# Patient Record
Sex: Female | Born: 1975 | Race: Black or African American | Hispanic: No | Marital: Single | Smoking: Never smoker
Health system: Southern US, Community
[De-identification: ages and names within clinical notes are randomized; demographics above are authoritative.]

## PROBLEM LIST (undated history)

## (undated) DIAGNOSIS — I10 Essential (primary) hypertension: Secondary | ICD-10-CM

## (undated) DIAGNOSIS — K5792 Diverticulitis of intestine, part unspecified, without perforation or abscess without bleeding: Secondary | ICD-10-CM

## (undated) HISTORY — PX: ABDOMINAL HYSTERECTOMY: SHX81

## (undated) HISTORY — PX: CHOLECYSTECTOMY: SHX55

## (undated) HISTORY — PX: SMALL INTESTINE SURGERY: SHX150

## (undated) HISTORY — PX: APPENDECTOMY: SHX54

---

## 1997-12-31 ENCOUNTER — Other Ambulatory Visit: Admission: RE | Admit: 1997-12-31 | Discharge: 1997-12-31 | Payer: Self-pay | Admitting: Obstetrics & Gynecology

## 1999-08-31 ENCOUNTER — Other Ambulatory Visit: Admission: RE | Admit: 1999-08-31 | Discharge: 1999-08-31 | Payer: Self-pay | Admitting: Obstetrics and Gynecology

## 2000-05-29 ENCOUNTER — Encounter: Admission: RE | Admit: 2000-05-29 | Discharge: 2000-05-29 | Payer: Self-pay | Admitting: Internal Medicine

## 2000-05-29 ENCOUNTER — Encounter: Payer: Self-pay | Admitting: Internal Medicine

## 2000-11-30 ENCOUNTER — Other Ambulatory Visit: Admission: RE | Admit: 2000-11-30 | Discharge: 2000-11-30 | Payer: Self-pay | Admitting: Obstetrics and Gynecology

## 2001-12-31 ENCOUNTER — Other Ambulatory Visit: Admission: RE | Admit: 2001-12-31 | Discharge: 2001-12-31 | Payer: Self-pay | Admitting: Obstetrics and Gynecology

## 2003-01-02 ENCOUNTER — Other Ambulatory Visit: Admission: RE | Admit: 2003-01-02 | Discharge: 2003-01-02 | Payer: Self-pay | Admitting: Obstetrics and Gynecology

## 2003-01-18 ENCOUNTER — Emergency Department (HOSPITAL_COMMUNITY): Admission: EM | Admit: 2003-01-18 | Discharge: 2003-01-18 | Payer: Self-pay | Admitting: Emergency Medicine

## 2003-01-20 ENCOUNTER — Ambulatory Visit (HOSPITAL_COMMUNITY): Admission: RE | Admit: 2003-01-20 | Discharge: 2003-01-20 | Payer: Self-pay | Admitting: Emergency Medicine

## 2003-01-20 ENCOUNTER — Encounter: Payer: Self-pay | Admitting: Emergency Medicine

## 2003-01-22 ENCOUNTER — Encounter (INDEPENDENT_AMBULATORY_CARE_PROVIDER_SITE_OTHER): Payer: Self-pay | Admitting: *Deleted

## 2003-01-22 ENCOUNTER — Ambulatory Visit (HOSPITAL_COMMUNITY): Admission: RE | Admit: 2003-01-22 | Discharge: 2003-01-23 | Payer: Self-pay | Admitting: Surgery

## 2003-01-22 ENCOUNTER — Encounter: Payer: Self-pay | Admitting: Surgery

## 2003-06-04 ENCOUNTER — Emergency Department (HOSPITAL_COMMUNITY): Admission: EM | Admit: 2003-06-04 | Discharge: 2003-06-04 | Payer: Self-pay | Admitting: Emergency Medicine

## 2004-04-29 ENCOUNTER — Other Ambulatory Visit: Admission: RE | Admit: 2004-04-29 | Discharge: 2004-04-29 | Payer: Self-pay | Admitting: Obstetrics and Gynecology

## 2004-07-01 ENCOUNTER — Emergency Department (HOSPITAL_COMMUNITY): Admission: EM | Admit: 2004-07-01 | Discharge: 2004-07-01 | Payer: Self-pay | Admitting: Emergency Medicine

## 2005-05-11 ENCOUNTER — Other Ambulatory Visit: Admission: RE | Admit: 2005-05-11 | Discharge: 2005-05-11 | Payer: Self-pay | Admitting: Obstetrics and Gynecology

## 2006-04-18 ENCOUNTER — Inpatient Hospital Stay (HOSPITAL_COMMUNITY): Admission: AD | Admit: 2006-04-18 | Discharge: 2006-04-18 | Payer: Self-pay | Admitting: Obstetrics and Gynecology

## 2006-05-05 ENCOUNTER — Inpatient Hospital Stay (HOSPITAL_COMMUNITY): Admission: AD | Admit: 2006-05-05 | Discharge: 2006-05-05 | Payer: Self-pay | Admitting: Obstetrics and Gynecology

## 2006-05-29 ENCOUNTER — Inpatient Hospital Stay (HOSPITAL_COMMUNITY): Admission: AD | Admit: 2006-05-29 | Discharge: 2006-06-01 | Payer: Self-pay | Admitting: Obstetrics and Gynecology

## 2006-05-30 ENCOUNTER — Encounter (INDEPENDENT_AMBULATORY_CARE_PROVIDER_SITE_OTHER): Payer: Self-pay | Admitting: *Deleted

## 2007-10-29 ENCOUNTER — Inpatient Hospital Stay (HOSPITAL_COMMUNITY): Admission: EM | Admit: 2007-10-29 | Discharge: 2007-11-03 | Payer: Self-pay | Admitting: Emergency Medicine

## 2007-12-01 ENCOUNTER — Emergency Department (HOSPITAL_COMMUNITY): Admission: EM | Admit: 2007-12-01 | Discharge: 2007-12-01 | Payer: Self-pay | Admitting: Emergency Medicine

## 2007-12-10 ENCOUNTER — Inpatient Hospital Stay (HOSPITAL_COMMUNITY): Admission: AD | Admit: 2007-12-10 | Discharge: 2007-12-22 | Payer: Self-pay | Admitting: Internal Medicine

## 2007-12-26 ENCOUNTER — Encounter: Admission: RE | Admit: 2007-12-26 | Discharge: 2007-12-26 | Payer: Self-pay | Admitting: *Deleted

## 2008-02-11 ENCOUNTER — Encounter: Admission: RE | Admit: 2008-02-11 | Discharge: 2008-02-11 | Payer: Self-pay | Admitting: General Surgery

## 2008-03-25 ENCOUNTER — Inpatient Hospital Stay (HOSPITAL_COMMUNITY): Admission: EM | Admit: 2008-03-25 | Discharge: 2008-04-02 | Payer: Self-pay | Admitting: Emergency Medicine

## 2008-03-26 ENCOUNTER — Ambulatory Visit: Payer: Self-pay | Admitting: Internal Medicine

## 2008-03-26 ENCOUNTER — Encounter (INDEPENDENT_AMBULATORY_CARE_PROVIDER_SITE_OTHER): Payer: Self-pay | Admitting: General Surgery

## 2008-08-05 ENCOUNTER — Inpatient Hospital Stay (HOSPITAL_COMMUNITY): Admission: EM | Admit: 2008-08-05 | Discharge: 2008-08-28 | Payer: Self-pay | Admitting: Emergency Medicine

## 2008-08-06 ENCOUNTER — Ambulatory Visit: Payer: Self-pay | Admitting: Infectious Diseases

## 2008-11-29 ENCOUNTER — Inpatient Hospital Stay (HOSPITAL_COMMUNITY): Admission: EM | Admit: 2008-11-29 | Discharge: 2008-12-11 | Payer: Self-pay | Admitting: Emergency Medicine

## 2008-12-03 ENCOUNTER — Encounter (INDEPENDENT_AMBULATORY_CARE_PROVIDER_SITE_OTHER): Payer: Self-pay | Admitting: General Surgery

## 2009-07-29 ENCOUNTER — Emergency Department (HOSPITAL_COMMUNITY): Admission: EM | Admit: 2009-07-29 | Discharge: 2009-07-29 | Payer: Self-pay | Admitting: Emergency Medicine

## 2009-08-10 ENCOUNTER — Emergency Department (HOSPITAL_COMMUNITY): Admission: EM | Admit: 2009-08-10 | Discharge: 2009-08-10 | Payer: Self-pay | Admitting: Emergency Medicine

## 2009-09-14 ENCOUNTER — Emergency Department (HOSPITAL_COMMUNITY): Admission: EM | Admit: 2009-09-14 | Discharge: 2009-09-14 | Payer: Self-pay | Admitting: Family Medicine

## 2009-12-22 IMAGING — CT CT PELVIS W/ CM
2 of 5 series · 16 of 46 positions shown, 18 images · IV contrast (APPLIED)
Comparison: 08/12/2008.

CT ABDOMEN

CLINICAL DATA: Abdominal pain.  Diverticulitis.  Status post
drainage.

CT ABDOMEN AND PELVIS WITH CONTRAST
TECHNIQUE: Multidetector CT imaging of the abdomen and pelvis was
performed using the standard protocol following bolus
administration of intravenous contrast.
Contrast: 100 ml Omnipaque 300.  The patient was premedicated with
steroids per standard protocol.

[Series 2: abd/pelv with 5.0 b31f st · axial · 0.81mm/px · z∈[+686,+1141]mm · 13 of 103 slices shown, 15 images]
[im 6/103  soft-tissue]
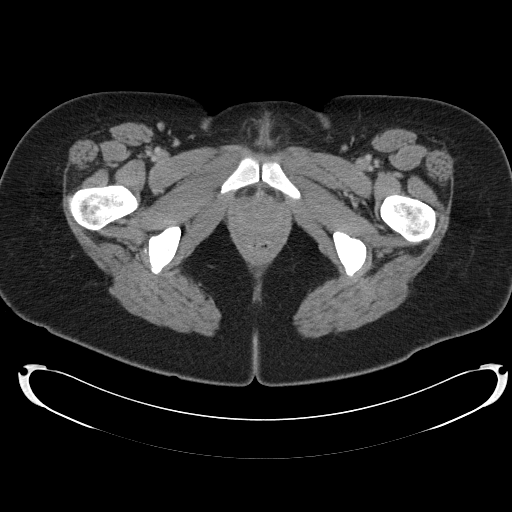
[im 6/103  bone]
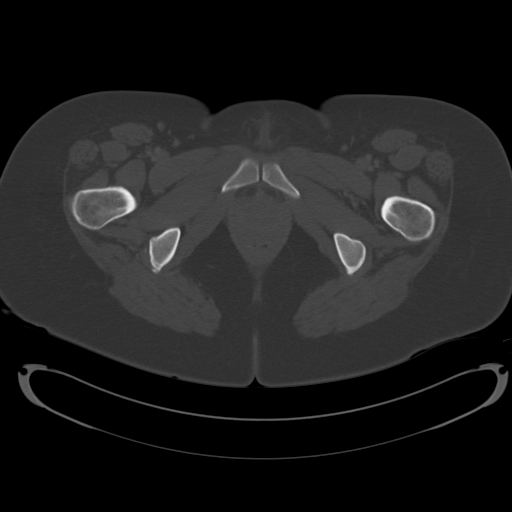
[im 12/103  soft-tissue]
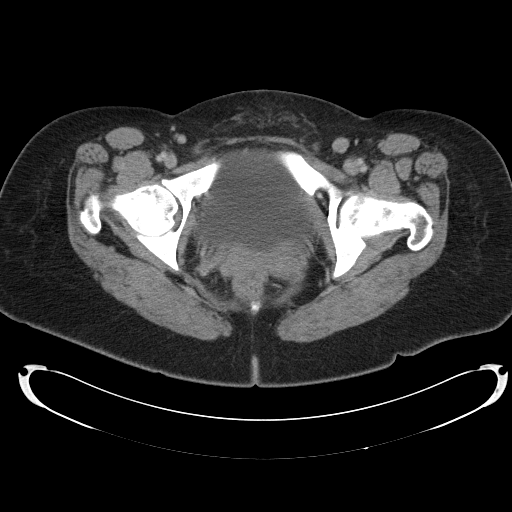
[im 23/103  soft-tissue]
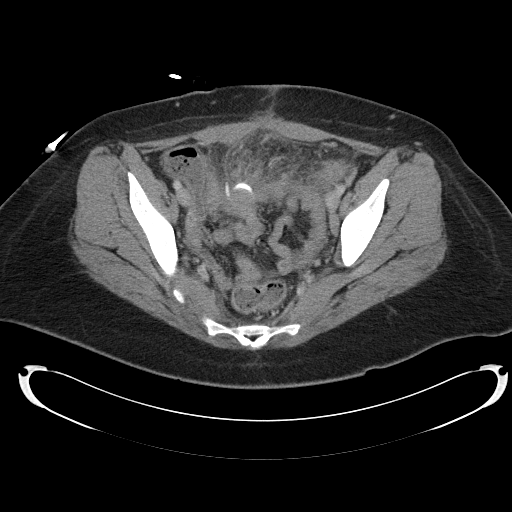
[im 29/103  soft-tissue]
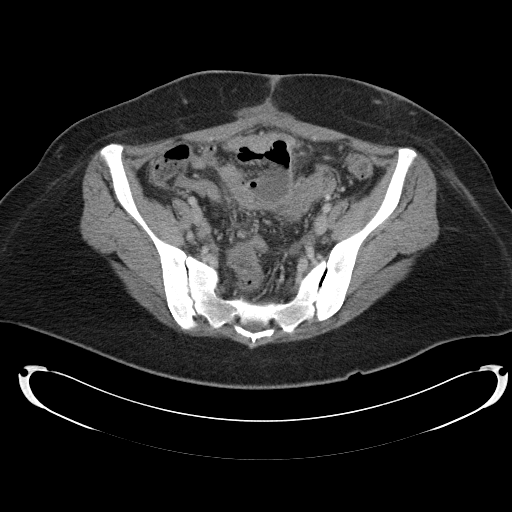
[im 35/103  soft-tissue]
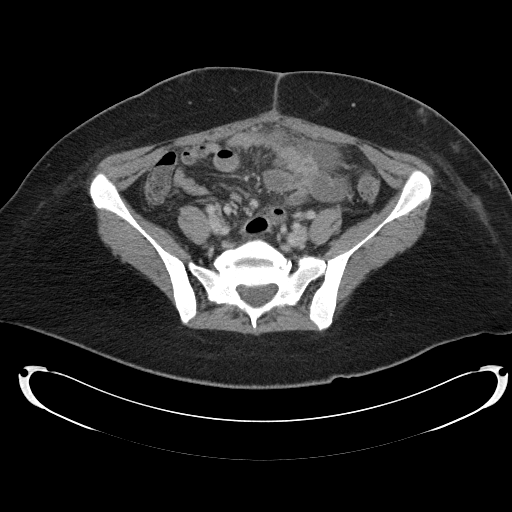
[im 46/103  soft-tissue]
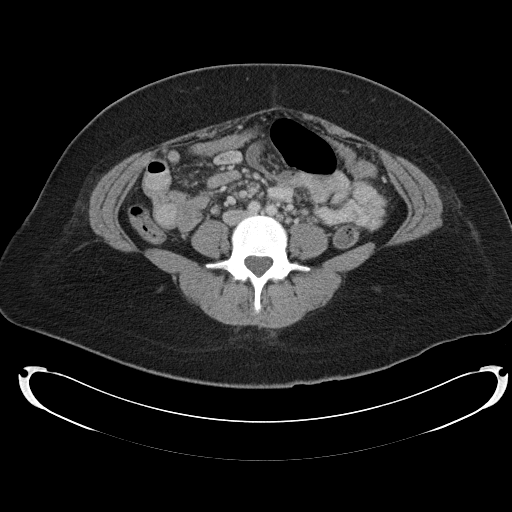
[im 52/103  soft-tissue]
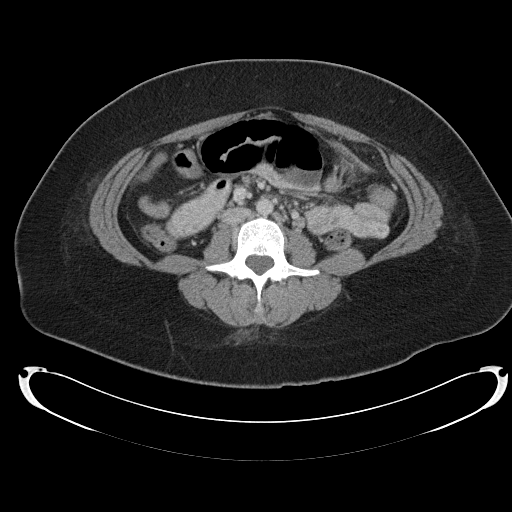
[im 57/103  soft-tissue]
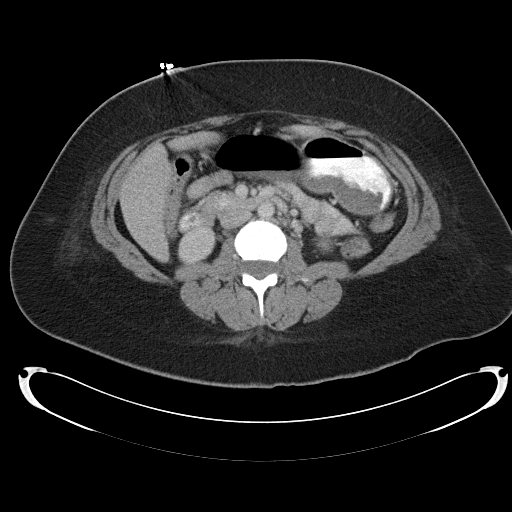
[im 69/103  soft-tissue]
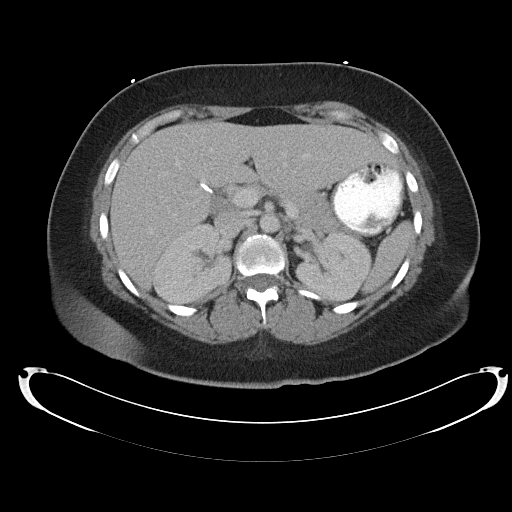
[im 69/103  bone]
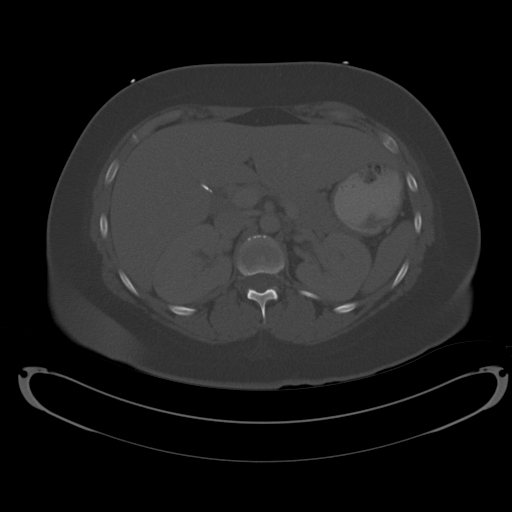
[im 74/103  soft-tissue]
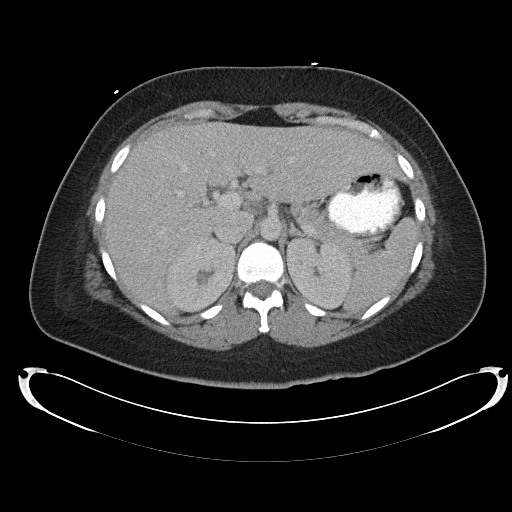
[im 80/103  soft-tissue]
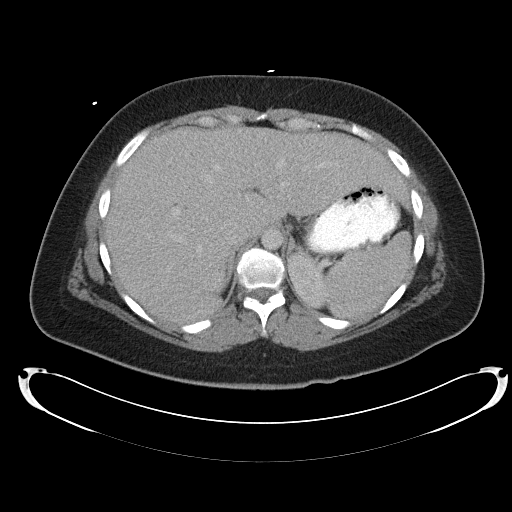
[im 91/103  soft-tissue]
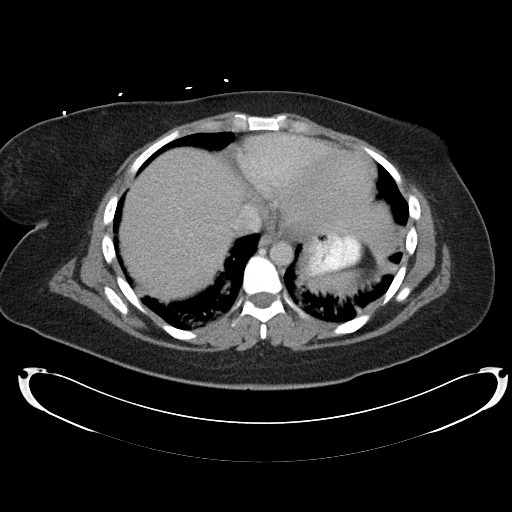
[im 97/103  soft-tissue]
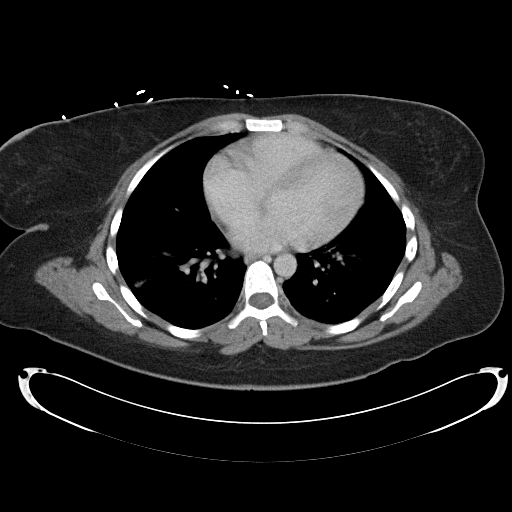

[Series 6: abd/pelv with 2.0 spo cor st · coronal · 1.00mm/px · 3 of 115 slices shown]
[im 39/115  soft-tissue]
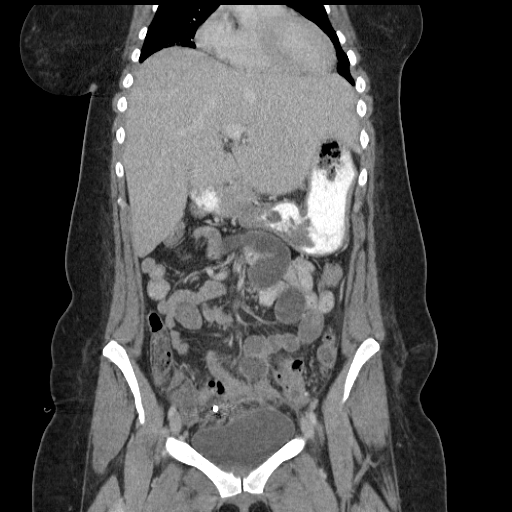
[im 51/115  soft-tissue]
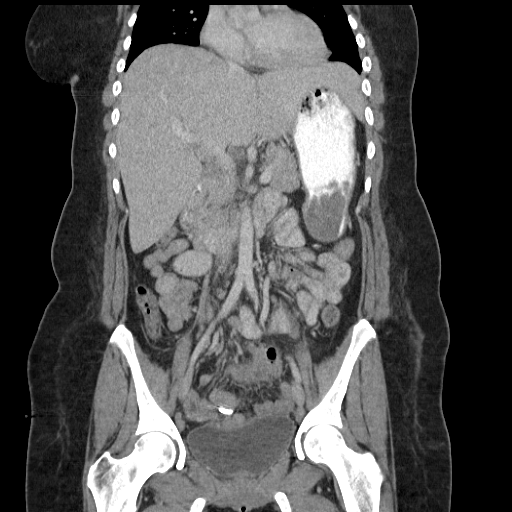
[im 64/115  soft-tissue]
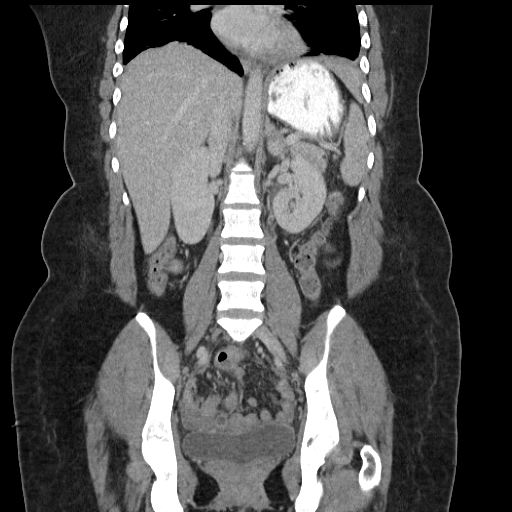

[16 of 46 positions shown; findings below may reference images not displayed]

FINDINGS: Patchy airspace disease at the lung bases bilaterally is
most compatible with atelectasis.  The heart size is normal.  The
liver and spleen are unremarkable.  The stomach, pancreas, and
common bile duct are within normal limits.  The adrenal glands and
kidneys are normal.  There are focally dilated loops of small bowel
with air-fluid levels in the mid abdomen.  The most proximal small
bowel is unremarkable and not dilated.  Bone windows are
unremarkable.
IMPRESSION: 1.  Persistent partial small bowel obstruction with transition
point at the level of the abscess in the pelvis.  The most proximal
small bowel is not dilated, suggesting interval improvement.
2.  Status post cholecystectomy.

CT PELVIS
FINDINGS: Inflammatory changes persist about the sigmoid colon
compatible with diverticulitis.  A drainage catheter can be seen
within the pelvis.  Fluid around this catheter is drained.  There
are two persistent loculated areas of fluid.  The first is just
above the drainage catheter and appears partially drained since the
prior exam.  It measures 2.4 x 2.1 by 2.1 cm.  Further to the left
is slightly larger collection, now the largest collection measuring
2.4 x 3.6 x 2.2 cm.  It is unclear whether these two collections
communicate although the both do appear smaller than on the prior
exam.  The urinary bladder is within normal limits.  The patient
appears to be status post hysterectomy.
IMPRESSION: 1.  Significant decrease in pelvic fluid collections status post
placement of a drainage catheter.  Two small collections remain as
described.
2.  Inflammatory changes of the mesentery persist.
3.  Status post hysterectomy.

## 2010-02-11 ENCOUNTER — Emergency Department (HOSPITAL_COMMUNITY): Admission: EM | Admit: 2010-02-11 | Discharge: 2010-02-11 | Payer: Self-pay | Admitting: Family Medicine

## 2010-03-02 ENCOUNTER — Emergency Department (HOSPITAL_COMMUNITY): Admission: EM | Admit: 2010-03-02 | Discharge: 2010-03-02 | Payer: Self-pay | Admitting: Family Medicine

## 2010-04-06 ENCOUNTER — Emergency Department (HOSPITAL_COMMUNITY): Admission: EM | Admit: 2010-04-06 | Discharge: 2010-04-06 | Payer: Self-pay | Admitting: Emergency Medicine

## 2010-04-14 ENCOUNTER — Emergency Department (HOSPITAL_COMMUNITY): Admission: EM | Admit: 2010-04-14 | Discharge: 2010-04-14 | Payer: Self-pay | Admitting: Emergency Medicine

## 2010-09-05 ENCOUNTER — Encounter: Payer: Self-pay | Admitting: Obstetrics and Gynecology

## 2010-09-05 ENCOUNTER — Encounter (HOSPITAL_BASED_OUTPATIENT_CLINIC_OR_DEPARTMENT_OTHER): Payer: Self-pay | Admitting: General Surgery

## 2010-11-02 ENCOUNTER — Emergency Department (HOSPITAL_COMMUNITY)
Admission: EM | Admit: 2010-11-02 | Discharge: 2010-11-02 | Disposition: A | Discharge status: Home / Self Care | Payer: Self-pay | Attending: Emergency Medicine | Admitting: Emergency Medicine

## 2010-11-02 ENCOUNTER — Emergency Department (HOSPITAL_COMMUNITY): Payer: Self-pay

## 2010-11-02 DIAGNOSIS — R071 Chest pain on breathing: Secondary | ICD-10-CM | POA: Insufficient documentation

## 2010-11-02 DIAGNOSIS — R0602 Shortness of breath: Secondary | ICD-10-CM | POA: Insufficient documentation

## 2010-11-02 DIAGNOSIS — R0609 Other forms of dyspnea: Secondary | ICD-10-CM | POA: Insufficient documentation

## 2010-11-02 DIAGNOSIS — R0989 Other specified symptoms and signs involving the circulatory and respiratory systems: Secondary | ICD-10-CM | POA: Insufficient documentation

## 2010-11-02 LAB — DIFFERENTIAL
Basophils Relative: 0 % (ref 0–1)
Eosinophils Absolute: 0.1 10*3/uL (ref 0.0–0.7)
Monocytes Absolute: 0.6 10*3/uL (ref 0.1–1.0)
Monocytes Relative: 9 % (ref 3–12)
Neutrophils Relative %: 54 % (ref 43–77)

## 2010-11-02 LAB — COMPREHENSIVE METABOLIC PANEL
AST: 23 U/L (ref 0–37)
BUN: 6 mg/dL (ref 6–23)
CO2: 29 mEq/L (ref 19–32)
Chloride: 102 mEq/L (ref 96–112)
Creatinine, Ser: 0.8 mg/dL (ref 0.4–1.2)
GFR calc non Af Amer: >60 mL/min (ref >60)
Total Bilirubin: 0.2 mg/dL — ABNORMAL LOW (ref 0.3–1.2)

## 2010-11-02 LAB — CBC
MCH: 31.2 pg (ref 26.0–34.0)
MCHC: 34.3 g/dL (ref 30.0–36.0)
Platelets: 333 10*3/uL (ref 150–400)
RDW: 12.9 % (ref 11.5–15.5)

## 2010-11-02 LAB — POCT CARDIAC MARKERS

## 2010-11-24 LAB — URINE MICROSCOPIC-ADD ON

## 2010-11-24 LAB — BASIC METABOLIC PANEL
BUN: 4 mg/dL — ABNORMAL LOW (ref 6–23)
BUN: 9 mg/dL (ref 6–23)
CO2: 29 mEq/L (ref 19–32)
Calcium: 7.9 mg/dL — ABNORMAL LOW (ref 8.4–10.5)
Calcium: 8.1 mg/dL — ABNORMAL LOW (ref 8.4–10.5)
Calcium: 8.3 mg/dL — ABNORMAL LOW (ref 8.4–10.5)
Calcium: 8.4 mg/dL (ref 8.4–10.5)
Chloride: 104 mEq/L (ref 96–112)
Chloride: 104 mEq/L (ref 96–112)
Chloride: 105 mEq/L (ref 96–112)
Creatinine, Ser: 0.56 mg/dL (ref 0.4–1.2)
Creatinine, Ser: 0.65 mg/dL (ref 0.4–1.2)
Creatinine, Ser: 0.83 mg/dL (ref 0.4–1.2)
GFR calc Af Amer: >60 mL/min (ref >60)
GFR calc Af Amer: >60 mL/min (ref >60)
GFR calc Af Amer: >60 mL/min (ref >60)
GFR calc Af Amer: >60 mL/min (ref >60)
GFR calc non Af Amer: >60 mL/min (ref >60)
GFR calc non Af Amer: >60 mL/min (ref >60)
GFR calc non Af Amer: >60 mL/min (ref >60)
GFR calc non Af Amer: >60 mL/min (ref >60)
Glucose, Bld: 123 mg/dL — ABNORMAL HIGH (ref 70–99)
Glucose, Bld: 93 mg/dL (ref 70–99)
Potassium: 3.4 mEq/L — ABNORMAL LOW (ref 3.5–5.1)
Sodium: 129 mEq/L — ABNORMAL LOW (ref 135–145)

## 2010-11-24 LAB — URINALYSIS, ROUTINE W REFLEX MICROSCOPIC
Bilirubin Urine: NEGATIVE
Nitrite: NEGATIVE
Protein, ur: NEGATIVE mg/dL
Specific Gravity, Urine: 1.026 (ref 1.005–1.030)
Urobilinogen, UA: 0.2 mg/dL (ref 0.0–1.0)

## 2010-11-24 LAB — CBC
HCT: 30.3 % — ABNORMAL LOW (ref 36.0–46.0)
HCT: 35 % — ABNORMAL LOW (ref 36.0–46.0)
Hemoglobin: 10.8 g/dL — ABNORMAL LOW (ref 12.0–15.0)
Hemoglobin: 11.1 g/dL — ABNORMAL LOW (ref 12.0–15.0)
Hemoglobin: 12.2 g/dL (ref 12.0–15.0)
MCHC: 34.4 g/dL (ref 30.0–36.0)
MCHC: 34.9 g/dL (ref 30.0–36.0)
MCHC: 35.2 g/dL (ref 30.0–36.0)
MCV: 94.1 fL (ref 78.0–100.0)
MCV: 94.4 fL (ref 78.0–100.0)
MCV: 94.9 fL (ref 78.0–100.0)
Platelets: 259 10*3/uL (ref 150–400)
Platelets: 267 10*3/uL (ref 150–400)
Platelets: 274 10*3/uL (ref 150–400)
Platelets: 313 10*3/uL (ref 150–400)
RBC: 2.99 MIL/uL — ABNORMAL LOW (ref 3.87–5.11)
RBC: 3.21 MIL/uL — ABNORMAL LOW (ref 3.87–5.11)
RBC: 3.24 MIL/uL — ABNORMAL LOW (ref 3.87–5.11)
RBC: 3.4 MIL/uL — ABNORMAL LOW (ref 3.87–5.11)
RBC: 3.66 MIL/uL — ABNORMAL LOW (ref 3.87–5.11)
RDW: 13.2 % (ref 11.5–15.5)
RDW: 14 % (ref 11.5–15.5)
WBC: 4.5 10*3/uL (ref 4.0–10.5)
WBC: 5.3 10*3/uL (ref 4.0–10.5)
WBC: 6.2 10*3/uL (ref 4.0–10.5)
WBC: 8.2 10*3/uL (ref 4.0–10.5)

## 2010-11-24 LAB — CULTURE, ROUTINE-ABSCESS

## 2010-11-24 LAB — COMPREHENSIVE METABOLIC PANEL
ALT: 8 U/L (ref 0–35)
Alkaline Phosphatase: 13 U/L — ABNORMAL LOW (ref 39–117)
CO2: 28 mEq/L (ref 19–32)
Calcium: 7.8 mg/dL — ABNORMAL LOW (ref 8.4–10.5)
Chloride: 99 mEq/L (ref 96–112)
GFR calc non Af Amer: >60 mL/min (ref >60)
Glucose, Bld: 106 mg/dL — ABNORMAL HIGH (ref 70–99)
Sodium: 134 mEq/L — ABNORMAL LOW (ref 135–145)
Total Bilirubin: 0.4 mg/dL (ref 0.3–1.2)

## 2010-11-24 LAB — DIFFERENTIAL
Eosinophils Absolute: 0 10*3/uL (ref 0.0–0.7)
Eosinophils Relative: 0 % (ref 0–5)
Lymphs Abs: 0.6 10*3/uL — ABNORMAL LOW (ref 0.7–4.0)
Monocytes Absolute: 0.4 10*3/uL (ref 0.1–1.0)
Monocytes Relative: 5 % (ref 3–12)

## 2010-11-24 LAB — TYPE AND SCREEN
ABO/RH(D): A POS
Antibody Screen: NEGATIVE

## 2010-11-24 LAB — GLUCOSE, CAPILLARY: Glucose-Capillary: 119 mg/dL — ABNORMAL HIGH (ref 70–99)

## 2010-11-24 LAB — PHOSPHORUS: Phosphorus: 3.5 mg/dL (ref 2.3–4.6)

## 2010-11-24 LAB — POCT I-STAT, CHEM 8
BUN: 13 mg/dL (ref 6–23)
Calcium, Ion: 1.07 mmol/L — ABNORMAL LOW (ref 1.12–1.32)
Creatinine, Ser: 1 mg/dL (ref 0.4–1.2)
Glucose, Bld: 105 mg/dL — ABNORMAL HIGH (ref 70–99)
Hemoglobin: 12.9 g/dL (ref 12.0–15.0)
Sodium: 137 mEq/L (ref 135–145)
TCO2: 25 mmol/L (ref 0–100)

## 2010-11-24 LAB — ANAEROBIC CULTURE

## 2010-11-29 LAB — DIFFERENTIAL
Band Neutrophils: 0 % (ref 0–10)
Basophils Absolute: 0 10*3/uL (ref 0.0–0.1)
Basophils Absolute: 0 10*3/uL (ref 0.0–0.1)
Blasts: 0 %
Blasts: 0 %
Eosinophils Absolute: 0 10*3/uL (ref 0.0–0.7)
Eosinophils Absolute: 0 10*3/uL (ref 0.0–0.7)
Eosinophils Absolute: 0.1 10*3/uL (ref 0.0–0.7)
Eosinophils Relative: 1 % (ref 0–5)
Lymphocytes Relative: 15 % (ref 12–46)
Lymphocytes Relative: 34 % (ref 12–46)
Lymphocytes Relative: 37 % (ref 12–46)
Lymphocytes Relative: 41 % (ref 12–46)
Lymphs Abs: 0.5 10*3/uL — ABNORMAL LOW (ref 0.7–4.0)
Lymphs Abs: 1.4 10*3/uL (ref 0.7–4.0)
Lymphs Abs: 2.6 10*3/uL (ref 0.7–4.0)
Metamyelocytes Relative: 0 %
Monocytes Absolute: 1.1 10*3/uL — ABNORMAL HIGH (ref 0.1–1.0)
Monocytes Relative: 27 % — ABNORMAL HIGH (ref 3–12)
Myelocytes: 0 %
Neutro Abs: 1.5 10*3/uL — ABNORMAL LOW (ref 1.7–7.7)
Neutro Abs: 1.8 10*3/uL (ref 1.7–7.7)
Neutrophils Relative %: 37 % — ABNORMAL LOW (ref 43–77)
Neutrophils Relative %: 41 % — ABNORMAL LOW (ref 43–77)
Neutrophils Relative %: 46 % (ref 43–77)
Neutrophils Relative %: 78 % — ABNORMAL HIGH (ref 43–77)
Promyelocytes Absolute: 0 %
Promyelocytes Absolute: 0 %
nRBC: 0 /100 WBC
nRBC: 0 /100 WBC

## 2010-11-29 LAB — COMPREHENSIVE METABOLIC PANEL
ALT: 14 U/L (ref 0–35)
ALT: 23 U/L (ref 0–35)
AST: 13 U/L (ref 0–37)
Albumin: 2.8 g/dL — ABNORMAL LOW (ref 3.5–5.2)
Albumin: 2.9 g/dL — ABNORMAL LOW (ref 3.5–5.2)
Alkaline Phosphatase: 23 U/L — ABNORMAL LOW (ref 39–117)
Alkaline Phosphatase: 24 U/L — ABNORMAL LOW (ref 39–117)
BUN: 11 mg/dL (ref 6–23)
BUN: 9 mg/dL (ref 6–23)
CO2: 24 mEq/L (ref 19–32)
CO2: 28 mEq/L (ref 19–32)
Calcium: 8.8 mg/dL (ref 8.4–10.5)
Calcium: 8.8 mg/dL (ref 8.4–10.5)
Calcium: 8.8 mg/dL (ref 8.4–10.5)
Chloride: 99 mEq/L (ref 96–112)
Creatinine, Ser: 0.6 mg/dL (ref 0.4–1.2)
Creatinine, Ser: 0.64 mg/dL (ref 0.4–1.2)
GFR calc Af Amer: >60 mL/min (ref >60)
GFR calc non Af Amer: >60 mL/min (ref >60)
GFR calc non Af Amer: >60 mL/min (ref >60)
Glucose, Bld: 265 mg/dL — ABNORMAL HIGH (ref 70–99)
Glucose, Bld: 91 mg/dL (ref 70–99)
Glucose, Bld: 96 mg/dL (ref 70–99)
Potassium: 3.8 mEq/L (ref 3.5–5.1)
Potassium: 4.4 mEq/L (ref 3.5–5.1)
Sodium: 132 mEq/L — ABNORMAL LOW (ref 135–145)
Sodium: 136 mEq/L (ref 135–145)
Total Bilirubin: 0.6 mg/dL (ref 0.3–1.2)
Total Protein: 7.6 g/dL (ref 6.0–8.3)

## 2010-11-29 LAB — BASIC METABOLIC PANEL
BUN: 11 mg/dL (ref 6–23)
CO2: 22 mEq/L (ref 19–32)
CO2: 24 mEq/L (ref 19–32)
CO2: 24 mEq/L (ref 19–32)
CO2: 26 mEq/L (ref 19–32)
Calcium: 8.2 mg/dL — ABNORMAL LOW (ref 8.4–10.5)
Calcium: 9 mg/dL (ref 8.4–10.5)
Chloride: 101 mEq/L (ref 96–112)
Chloride: 92 mEq/L — ABNORMAL LOW (ref 96–112)
Creatinine, Ser: 0.63 mg/dL (ref 0.4–1.2)
Creatinine, Ser: 0.63 mg/dL (ref 0.4–1.2)
GFR calc Af Amer: >60 mL/min (ref >60)
GFR calc Af Amer: >60 mL/min (ref >60)
GFR calc Af Amer: >60 mL/min (ref >60)
GFR calc Af Amer: >60 mL/min (ref >60)
GFR calc non Af Amer: >60 mL/min (ref >60)
GFR calc non Af Amer: >60 mL/min (ref >60)
Glucose, Bld: 173 mg/dL — ABNORMAL HIGH (ref 70–99)
Glucose, Bld: 531 mg/dL (ref 70–99)
Potassium: 3.1 mEq/L — ABNORMAL LOW (ref 3.5–5.1)
Potassium: 3.8 mEq/L (ref 3.5–5.1)
Potassium: 4.9 mEq/L (ref 3.5–5.1)
Sodium: 132 mEq/L — ABNORMAL LOW (ref 135–145)
Sodium: 132 mEq/L — ABNORMAL LOW (ref 135–145)
Sodium: 136 mEq/L (ref 135–145)
Sodium: 137 mEq/L (ref 135–145)

## 2010-11-29 LAB — CBC
HCT: 29.7 % — ABNORMAL LOW (ref 36.0–46.0)
HCT: 30 % — ABNORMAL LOW (ref 36.0–46.0)
HCT: 30.8 % — ABNORMAL LOW (ref 36.0–46.0)
HCT: 31.3 % — ABNORMAL LOW (ref 36.0–46.0)
Hemoglobin: 10.1 g/dL — ABNORMAL LOW (ref 12.0–15.0)
Hemoglobin: 10.2 g/dL — ABNORMAL LOW (ref 12.0–15.0)
Hemoglobin: 10.6 g/dL — ABNORMAL LOW (ref 12.0–15.0)
Hemoglobin: 9.7 g/dL — ABNORMAL LOW (ref 12.0–15.0)
Hemoglobin: 9.8 g/dL — ABNORMAL LOW (ref 12.0–15.0)
Hemoglobin: 9.9 g/dL — ABNORMAL LOW (ref 12.0–15.0)
MCHC: 33 g/dL (ref 30.0–36.0)
MCHC: 33.3 g/dL (ref 30.0–36.0)
MCHC: 33.3 g/dL (ref 30.0–36.0)
MCHC: 33.3 g/dL (ref 30.0–36.0)
MCHC: 33.7 g/dL (ref 30.0–36.0)
MCHC: 33.8 g/dL (ref 30.0–36.0)
MCHC: 34.1 g/dL (ref 30.0–36.0)
MCHC: 34.7 g/dL (ref 30.0–36.0)
MCV: 94.2 fL (ref 78.0–100.0)
MCV: 94.2 fL (ref 78.0–100.0)
MCV: 94.5 fL (ref 78.0–100.0)
MCV: 94.5 fL (ref 78.0–100.0)
MCV: 94.9 fL (ref 78.0–100.0)
Platelets: 384 10*3/uL (ref 150–400)
Platelets: 405 10*3/uL — ABNORMAL HIGH (ref 150–400)
Platelets: 432 10*3/uL — ABNORMAL HIGH (ref 150–400)
Platelets: 447 10*3/uL — ABNORMAL HIGH (ref 150–400)
RBC: 3.04 MIL/uL — ABNORMAL LOW (ref 3.87–5.11)
RBC: 3.1 MIL/uL — ABNORMAL LOW (ref 3.87–5.11)
RBC: 3.14 MIL/uL — ABNORMAL LOW (ref 3.87–5.11)
RBC: 3.24 MIL/uL — ABNORMAL LOW (ref 3.87–5.11)
RBC: 3.25 MIL/uL — ABNORMAL LOW (ref 3.87–5.11)
RDW: 14.4 % (ref 11.5–15.5)
RDW: 14.4 % (ref 11.5–15.5)
RDW: 14.6 % (ref 11.5–15.5)
RDW: 14.7 % (ref 11.5–15.5)
RDW: 14.7 % (ref 11.5–15.5)
WBC: 11.1 10*3/uL — ABNORMAL HIGH (ref 4.0–10.5)
WBC: 4.3 10*3/uL (ref 4.0–10.5)

## 2010-11-29 LAB — URINALYSIS, ROUTINE W REFLEX MICROSCOPIC
Bilirubin Urine: NEGATIVE
Glucose, UA: NEGATIVE mg/dL
Leukocytes, UA: NEGATIVE
Nitrite: NEGATIVE
Specific Gravity, Urine: 1.011 (ref 1.005–1.030)
pH: 6 (ref 5.0–8.0)

## 2010-11-29 LAB — GLUCOSE, CAPILLARY
Glucose-Capillary: 103 mg/dL — ABNORMAL HIGH (ref 70–99)
Glucose-Capillary: 114 mg/dL — ABNORMAL HIGH (ref 70–99)
Glucose-Capillary: 116 mg/dL — ABNORMAL HIGH (ref 70–99)
Glucose-Capillary: 117 mg/dL — ABNORMAL HIGH (ref 70–99)
Glucose-Capillary: 129 mg/dL — ABNORMAL HIGH (ref 70–99)
Glucose-Capillary: 133 mg/dL — ABNORMAL HIGH (ref 70–99)
Glucose-Capillary: 133 mg/dL — ABNORMAL HIGH (ref 70–99)
Glucose-Capillary: 142 mg/dL — ABNORMAL HIGH (ref 70–99)
Glucose-Capillary: 170 mg/dL — ABNORMAL HIGH (ref 70–99)
Glucose-Capillary: 171 mg/dL — ABNORMAL HIGH (ref 70–99)
Glucose-Capillary: 223 mg/dL — ABNORMAL HIGH (ref 70–99)
Glucose-Capillary: 274 mg/dL — ABNORMAL HIGH (ref 70–99)
Glucose-Capillary: 294 mg/dL — ABNORMAL HIGH (ref 70–99)
Glucose-Capillary: 58 mg/dL — ABNORMAL LOW (ref 70–99)
Glucose-Capillary: 68 mg/dL — ABNORMAL LOW (ref 70–99)
Glucose-Capillary: 73 mg/dL (ref 70–99)
Glucose-Capillary: 76 mg/dL (ref 70–99)
Glucose-Capillary: 78 mg/dL (ref 70–99)
Glucose-Capillary: 79 mg/dL (ref 70–99)
Glucose-Capillary: 84 mg/dL (ref 70–99)
Glucose-Capillary: 90 mg/dL (ref 70–99)
Glucose-Capillary: 91 mg/dL (ref 70–99)
Glucose-Capillary: 92 mg/dL (ref 70–99)
Glucose-Capillary: 96 mg/dL (ref 70–99)
Glucose-Capillary: 98 mg/dL (ref 70–99)

## 2010-11-29 LAB — CATH TIP CULTURE: Culture: NO GROWTH

## 2010-11-29 LAB — CLOSTRIDIUM DIFFICILE EIA: C difficile Toxins A+B, EIA: NEGATIVE

## 2010-11-29 LAB — CULTURE, BLOOD (ROUTINE X 2): Culture: NO GROWTH

## 2010-11-29 LAB — MAGNESIUM
Magnesium: 1.7 mg/dL (ref 1.5–2.5)
Magnesium: 1.9 mg/dL (ref 1.5–2.5)
Magnesium: 2.2 mg/dL (ref 1.5–2.5)

## 2010-11-29 LAB — PHOSPHORUS
Phosphorus: 2.7 mg/dL (ref 2.3–4.6)
Phosphorus: 4.1 mg/dL (ref 2.3–4.6)
Phosphorus: 5.2 mg/dL — ABNORMAL HIGH (ref 2.3–4.6)

## 2010-11-29 LAB — URINE MICROSCOPIC-ADD ON

## 2010-11-29 LAB — URINE CULTURE: Special Requests: NEGATIVE

## 2010-12-28 NOTE — Discharge Summary (Signed)
[name], [name]                  ACCOUNT NO.:  [id number]   MEDICAL RECORD NO.:  [id number]          PATIENT TYPE:  INP   LOCATION:  1526                         FACILITY:  [facility]   PHYSICIAN:  Mobolaji B. Bakare, M.D.DATE OF BIRTH:  11/12/1975   DATE OF ADMISSION:  12/10/2007  DATE OF DISCHARGE:  12/22/2007                               DISCHARGE SUMMARY   PRIMARY CARE PHYSICIAN:  Unassigned.   GYNECOLOGIST:  Naima A. Normand Sloop, M.D.   FINAL DIAGNOSES:  1. Sepsis secondary to acute diverticulitis.  2. Acute diverticulitis with microperforations.  3. Pelvic abscess secondary to acute diverticulitis.   CONSULTANT:  1. Gastroenterology consult provided by Graylin Shiver, MD.  2. Surgical consult provided by Alfonse Ras, MD.  3. Interventional radiology consult provided by D. Oley Balm, MD.   PROCEDURES:  1. CT scan of abdomen and pelvis done on December 10, 2007, showed no      evidence of abdominal process, increased bibasilar atelectasis on      abdominal CT.  CT of the pelvis showed small amount of free fluid      in the inferior pelvis.  No distinct abscess identified  2. Ultrasound of the pelvis done on December 09, 2007, showed small to      moderate amount of complex fluid within the pelvis.  Normal      appearance of the uterus and adnexa.  Dominant follicle within the      left ovary.  3. CT scan of the abdomen done on Dec 17, 2007, showed small bilateral      pleural effusions, no acute abdominal process.  4. CT scan of the pelvis done on Dec 17, 2007, showed significant      increase in pelvic abscess collection measuring 5 x 14 cm in the      anterior component and smaller size of 4 x 9.9 cm, which is smaller      and deeper component to the first collection.  There also appears      to be a confluence at the left side of the uterine fundus.  There      was a new 8 x 15-mm collection inferior to the cecum and a 15 x 20-      mm collection in the anterior left  pelvis.  The colon was      decompressed.  5. CT-guided drainage by Dr. Deanne Coffer on Dec 17, 2007, yielded      approximately 155 mL of thin, purulent fluid.  6. CT-guided drainage of pelvic abscess via  left transgluteal      approach done on Dec 17, 2007, by Dr. Bonnielee Haff yielded 10 mL of pus.   BRIEF HISTORY:  Please refer to the admission H&P for full details.  In  brief, Ms. Turberville is a pleasant 35 year old African American female who  was in her usual state of health until the day of presentation, when she  developed left lower quadrant pain.  She initially thought it was  gynecologic in nature.  She had an IUD previously which she had  expelled, and she called her gynecologist and was initially evaluated by  Dr. Redmond Baseman team at [facility].  Upon initial evaluation with an  ultrasound of the pelvis which indicated pelvic ascites, a surgical  consult and GI consult were obtained.  It should be mentioned that the  patient was recently treated for diverticulitis in March 2009.  Upon  evaluation by Dr. Colin Benton and Dr. Evette Cristal, CT scan was obtained which showed  pelvic ascites.  The patient was transferred to [facility]  step-down unit.  She was appeared to be septic on transfer with  tachycardia.  Blood pressure was reasonable on arrival at 105/53.  She  had leukocytosis 17.3, and she was febrile with a temperature of 102.  The patient was empirically started on antibiotics aztreonam and  vancomycin (she is allergic to PENICILLIN).   HOSPITAL COURSE:  The patient was evaluated by Dr. Colin Benton and her  impression was recurrent or continued acute diverticulitis given recent  treatment for acute diverticulitis in March 2009.  She was started on  vancomycin, Flagyl and aztreonam.  The patient remained febrile  continuously and a repeat CT scan was done on Dec 17, 2007 which showed  increased pelvic ascites and abscess, hence an immediate/emergent  drainage was done by Dr. Deanne Coffer.  He  drained about 155 mL of purulent  fluid from the larger abscess collection.  A follow-up drainage was done  through a transgluteal approach to the smaller abscess collection on Dec 19, 2007.  The culture came back with no growth on both specimen, both  anaerobic and aerobic.  Blood cultures also no growth.  Therefore,  vancomycin was discontinued.  The patient was then started on  ciprofloxacin and Flagyl.  She has become afebrile 48 hours prior to  discharge.  Sepsis has resolved.  White cell count was 8.7 at time of  discharge.  The drainage from the pelvic drains was quite minimal with  the right drain (10 mL) and left transgluteal 12 drain (12 mL).  The  patient will be discharged home on p.o. antibiotics.  She will have a  repeat CT scan on Dec 26, 2007, and follow up with Dr. Colin Benton in 2-3  weeks.   DISCHARGE INSTRUCTIONS:  1. CT scan at [facility] on Dec 26, 2007, at 8:15 a.m.  2. Flush both drains with 5 mL sterile normal saline daily.  3. Change dressing daily.  4. Record output from each drain.  The patient was given instruction      to call if she has any questions.  5. Follow up with Dr. Colin Benton in 2-3 weeks.   DISCHARGE MEDICATIONS:  1. Vicodin one to two q.4-6 h. p.r.n.  2. Ciprofloxacin 500 mg b.i.d. for 1 week.  3. Duragesic patch 25 mcg every 72 hours.  4. Flagyl three times a day for 1 week.   DISCHARGE LABORATORY DATA:  White cell 8.7, hemoglobin 10.5, hematocrit  31, platelets 560.  Sodium 135, potassium 3.2 (repleted), BUN 1,  creatinine 0.64.   DISPOSITION:  The patient is going home.  Home health RN has been  arranged to follow up with the patient.      Mobolaji B. Corky Downs, M.D.  Electronically Signed     MBB/MEDQ  D:  12/22/2007  T:  12/22/2007  Job:  [id number]   cc:   Naima A. Normand Sloop, M.D.  Fax: [phone]   Alfonse Ras, MD  [address]  [place]  [place]  [address]   Graylin Shiver, M.D.  Fax: [phone]   D. Oley Balm III, M.D.  Fax: [phone]   Art A. Hoss, M.D.  [address]  [place], [place] [address]

## 2010-12-28 NOTE — H&P (Signed)
NAMEQUEENA, MONRREAL NO.:  [id number]   MEDICAL RECORD NO.:  [id number]          PATIENT TYPE:  INP   LOCATION:  2622                         FACILITY:  [facility]   PHYSICIAN:  Osborn Coho, M.D.   DATE OF BIRTH:  March 06, 1976   DATE OF ADMISSION:  03/24/2008  DATE OF DISCHARGE:                              HISTORY & PHYSICAL   I was called by Dr. Zachery Dakins on March 26, 2008, after Gastrografin  enema study did not reveal diverticulitis.  Dr. Zachery Dakins informed me  that the patient would need to go to the operating room and felt that  the patient's symptoms originated from PID.  ID had been consulted, and  they had agreed with the antibiotics that the patient was receiving, and  also recommended exploratory laparotomy.  I once again informed Dr.  Zachery Dakins that although it is possible, it is unlikely that the  patient's symptoms are originating from PID secondary to a history that  the patient gives of not having had intercourse in the last 8 months.  I  informed him that I felt along with Dr. Estanislado Pandy who spoke with the  patient about and who would be consulted intraoperatively if needed.  We  recommended that General Surgery takes the patient to the operating room  as the primary surgeon, and if they need an intraop consult from GYN,  Dr. Estanislado Pandy said she would be happy to come for the intraop consult.  Dr.  Estanislado Pandy also said that she would be happy to initially assist Dr.  Zachery Dakins in the exploratory laparotomy.  Dr. Zachery Dakins said that would  be fine.  He would take the patient for exploratory laparotomy as soon  as possible and would call Dr. Estanislado Pandy for intraop consult if needed.  Dr. Zachery Dakins asked if I could speak with Dagmar Hait in the event that  the patient needed a hysterectomy and removal of both ovaries and tubes,  and I did indeed speak with the patient by phone with Dr. Zachery Dakins in  the patient's room at that time as well the patient's mother, and  informed the patient that Dr. Zachery Dakins was planning on taking her to do  the exploratory laparotomy, and if they felt that there was a GYN  origin, would consult intraoperatively Dr. Dois Davenport Rivard at that time.  I also discussed with the patient that one of the options in the event  of a worse case scenario of PID might be removal of the uterus and both  ovaries and tubes in order to treat the infection.  I asked the patient  how she felt about having a hysterectomy and bilateral salpingo-  oophorectomy under both circumstances, and the patient informed me that  she was in complete agreement with removing the source of the infection  if that indeed would be the treatment.  I, therefore, consented the  patient at that time for the possibility of a total abdominal  hysterectomy and bilateral salpingo-oophorectomy, and explained to her  that there will be the removal of her uterus, bilateral ovaries, and  bilateral fallopian tubes, and  that she would no longer be able to have  children and would be menopausal.  The patient verbalized understanding.  I also discussed with her the risks, benefits, and alternatives of the  procedure including but not limited to bleeding, infection, and injury,  and the patient was in agreement and wanted to proceed with the  exploratory laparotomy and possible removal of the uterus, bilateral  ovaries, and fallopian tubes if that needed to be done.  Again, this  conversation took place by phone and Dr. Zachery Dakins was present in the  patient's room at the time of the conversation.      Osborn Coho, M.D.  Electronically Signed     AR/MEDQ  D:  03/26/2008  T:  03/27/2008  Job:  [id number]

## 2010-12-28 NOTE — Consult Note (Signed)
[name], [name]                  ACCOUNT NO.:  [id number]   MEDICAL RECORD NO.:  [id number]          PATIENT TYPE:  INP   LOCATION:  3704                         FACILITY:  [facility]   PHYSICIAN:  Sandria Bales. Ezzard Standing, M.D.  DATE OF BIRTH:  1976/07/11   DATE OF CONSULTATION:  08/05/2008  DATE OF DISCHARGE:                                 CONSULTATION   DATE OF CONSULTATION:  August 05, 2008.   TIME OF CONSULTATION:  13:40 p.m.   REQUESTING PHYSICIAN:  Marcellus Scott, MD   CONSULTING SURGEON:  Sandria Bales. Ezzard Standing, MD   GI:  Bernette Redbird, MD   REASON FOR CONSULTATION:  Diverticulitis.   HISTORY OF PRESENT ILLNESS:  [name] is a 35 year old black female with  a history of tubo-ovarian abscess in August 2009 who underwent a  hysterectomy with bilateral salpingectomies and a left oophorectomy, who  also has a history of recurrent diverticulitis this year.  Dr. Kathie Rhodes. Rivard  andDr. Zachery Dakins were involved in the surgery in August 2009   Apparently, yesterday she was at home and had an acute onset of  abdominal pain at approximately 11:30 a.m., which she states is similar  to her prior episodes that she has been hospitalized for in the past.  She does admit to having some nausea and vomiting.  She denies any  diarrhea and states that her last bowel movement was yesterday and it  was normal.  She has not seen any blood in her stool.  Mother provided  most of this history as well as asked most questions as the patient  would hardly speak, I believe secondary to pain.  Otherwise, the patient  denies chills but did have a fever up to 103.6 in the ED yesterday.  CT  scan was obtained, which question pneumonia in the right lung as well as  finding is consistent with sigmoid diverticulitis with pelvic ascites  but no abscess seen.  Currently, the patient is on Levaquin and Flagyl  as well as clear liquids.  At this time also, Gastroenterology has seen  the patient as well and believes that this is  probably an episode of  diverticulitis as well.  However, of note, Dr. Zachery Dakins made comment in  his operative report in August that upon evaluation and examination of  the colon during the OR, he did not see any diverticulosis of her colon.  She also had a barium enema that was done in August, which also states  that there was no significant colonic diverticula noted.  At this time,  we were asked to see the patient for assistance and management of this  problem.   REVIEW OF SYSTEMS:  Please see HPI.  Otherwise, the patient denies  chills, and she says that she is unaware of any fevers.  She admits to  nausea and vomiting but no diarrhea and no bloody bowel movements.  Otherwise, all other systems are currently negative.   PAST MEDICAL HISTORY:  1. History of diverticulitis.  2. Depression.   PAST SURGICAL HISTORY:  Exploratory laparotomy with hysterectomy  bilateral salpingectomies and  I believe a left oophorectomy in August  2009 by Dr. Kathie Rhodes. Rivard.  During this time, she also had her appendix  removed, and she had a laparoscopic cholecystectomy done by Dr. Daphine Deutscher  in 2004.   SOCIAL HISTORY:  The patient is a single mother.  She has twin children.  She denies alcohol, tobacco, or illicit drug use.   ALLERGIES:  PENICILLIN, however, apparently she tolerated UNASYN last  time she was here.   MEDICATIONS AT HOME:  She does not take any.   PHYSICAL EXAMINATION:  GENERAL:  [name] is a 35 year old black female  who is well developed, well nourished and appears lying in bed in mild  distress.  VITAL SIGNS:  Temperature is 104 currently, pulse 118, respirations 18,  and blood pressure 124/79.  HEENT:  Head is normocephalic, atraumatic.  Sclerae noninjected.  Pupils  are equal, round, and reactive to light.  Ears and nose without any  obvious masses or lesions.  No rhinorrhea.  Mouth is pink and moist.  Throat shows no exudate.  NECK:  Supple.  Trachea is midline.  No  thyromegaly.  HEART:  Regular rhythm; however, she is tachycardic.  She has normal S1  and S2.  No murmurs, gallops, or rubs are noted.  +2 carotid, radial,  and pedal pulses bilaterally.  LUNGS:  Clear to auscultation bilaterally.  No wheezes, rhonchi, or  rales are noted.  Respiratory effort in nonlabored.  ABDOMEN:  Soft yet diffusely tender with the greatest amount of  tenderness in her suprapubic and infraumbilical region with pain in her  left lower quadrant greater than her right lower quadrant.  She does not  have any bowel sounds and is currently nondistended.  She does have  positive guarding as well as a previous midline incision from her prior  operation.  MUSCULOSKELETAL:  All 4 extremities are symmetrical with no cyanosis,  clubbing, or edema.  NEUROLOGIC:  Cranial nerves II through XII appear to be grossly intact.  Deep tendon reflex exam is deferred at this time.  PSYCH:  The patient is alert and oriented x3 with somewhat blunted  affect.   LABORATORY DATA AND DIAGNOSTICS:  These are all from yesterday as no  labs have been performed today.  White blood cell count is 9,600,  hemoglobin 11.5, hematocrit 34.6, platelets 254,000, and neutrophil  count is 92%.  Sodium 137, potassium 3.2, BUN 7, creatinine 0.71, and  glucose 111.  Currently, I have ordered a stat CBC with diff as well as  BMET per Dr. Matthias Hughs.  A CT scan of the abdomen and pelvis shows probable sigmoid  diverticulitis with pelvic ascites and no abscess and a possible right  pneumonia.   IMPRESSION:  1. Probable sigmoid diverticulitis.  2. Questionable pneumonia.  3. Status post hysterectomy, bilateral salpingectomies, and left      oophorectomy.  4. History of depression.  5. History of recurrent diverticulitis, this year.   PLAN:  At this time our recommendation is for conservative management  with antibiotics and bowel rest.  There is a real chance she will need  surgery, though with her past  history, this may be difficult.  I would  consider broader  antibiotic coverage, such as Zosyn.  Because of her prior history of  pelvic inflammation this year, consider ID consult to help suggest  antibiotics.  Since this will be a long hospital course, consider PICC  and TPN early.  At this time the patient is refusing PICC  line  placement.  We will follow with you in this difficult patient.  [DN]      Letha Cape, PA      Sandria Bales. Ezzard Standing, M.D.  Electronically Signed    KEO/MEDQ  D:  08/05/2008  T:  08/06/2008  Job:  [id number]   cc:   Bernette Redbird, M.D.  Marcellus Scott, MD  Crist Fat. Rivard, M.D.

## 2010-12-28 NOTE — Consult Note (Signed)
[name], [name] NO.:  [id number]   MEDICAL RECORD NO.:  [id number]          PATIENT TYPE:  INP   LOCATION:  3703                         FACILITY:  [facility]   PHYSICIAN:  Bernette Redbird, M.D.   DATE OF BIRTH:  01/11/1976   DATE OF CONSULTATION:  08/04/2008  DATE OF DISCHARGE:                                 CONSULTATION   Dr. Waymon Amato of the [facility] hospitalists asked [place] to see this 35-year-  old female because of fever, abdominal pain, and an abnormal CT scan.   The patient was admitted to the hospital early this morning because of  the above problems.   It appears that her history goes back to March 2009 when she was  admitted to the hospital with apparent diverticulitis, and re-presented  roughly 6 weeks later with recurrent apparent diverticulitis and sepsis.  On the latter admission, there was actually a concern about pelvic  inflammatory disease and she was initially at [facility] before  being transferred to [facility].   The patient then had a recurrent problem in August 2009 with a very  similar type of presentation and this culminated in exploratory surgery,  at which time, she was thought to have a tubo-ovarian abscess and  underwent a hysterectomy, left salpingo-oophorectomy, and right  salpingectomy, leaving the right ovary.  During that operation, Dr.  Consuello Bossier was the general surgeon and examined the bowel and in  particular did not notice any evidence of diverticular disease or other  primary intestinal disease to account for the abscess.  I spoke with him  on the phone this afternoon and he is quite confident that he was able  to get a reasonable inspection and that diverticular disease was  unlikely.  This is corroborated by the fact that a preoperative  Gastrografin enema did not show any definite diverticular change.   With that background, the patient had the abrupt onset within the past  day or so of abdominal pain  and fever.  The pain is actually more in the  suprapubic and right lower abdominal region rather than the left lower  quadrant.  In the emergency room, she had a normal white count, but was  febrile to 103 and she had a CT scan showing marked inflammatory changes  in the lower abdomen with some small gas collections raising the  question of microperforations.  A discrete abscess was not seen.  It  sounds as though she had been doing quite well following her course of  antibiotic therapy from following her surgery in August.  Note that  cultures from her abscess in August grew out Bacteroides fragilis and  Klebsiella pneumoniae.   Also, it is noteworthy that she had an incidental appendectomy performed  at the time of that August operation, and 5 years earlier, had undergone  a laparoscopic cholecystectomy for cholelithiasis.   PAST MEDICAL HISTORY:  Allergy to PENICILLIN is stated, but she  tolerated Unasyn on a previous hospitalization.   OUTPATIENT MEDICATIONS:  Claritin and p.r.n. ibuprofen.   OPERATIONS:  See HPI, but this includes  laparoscopic cholecystectomy in  2004, and laparotomy with hysterectomy, unilateral salpingo-  oophorectomy, contralateral salpingectomy, and appendectomy in August  2009.   CHRONIC MEDICAL ILLNESSES:  Basically none other than the above-  mentioned problems.   HABITS:  Nonsmoker, essentially nondrinker, no illegal drugs.   FAMILY HISTORY:  The patient's mother had to have surgery for  complicated diverticulitis and her sister has apparently also had  diverticulitis.   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   REVIEW OF SYSTEMS:  Not really obtainable in detail at this time since  the patient has received pain medication and is not very communicative  with the interview.   PHYSICAL EXAMINATION:  VITAL SIGNS:  Pertinent for a current temperature  of 104 degrees and tachycardia  with pulse of 124, blood pressure 132/87,  and pulse 18.  GENERAL:  The patient is anicteric.  She is without frank pallor.  CHEST:  Clear to auscultation without wheezes, although she did cough up  some fairly thick slightly green-tinged sputum.  HEART:  Normal, except for the rapid rate.  ABDOMEN:  Sparse bowel sounds, primarily in the right lower quadrant, it  is mildly distended and somewhat diffusely tender, especially across the  lower abdomen.  She has had pain medications, but for what it is worth,  there was no evident rebound tenderness, no rigidity.  RECTAL:  Not performed, but the patient reported a normal bowel movement  yesterday.   LABORATORIES:  His white count from today is pending, but from yesterday  was 9600 with 92 polys and 5 lymphs, hemoglobin 11.5 with normal MCV,  and platelets normal.  Chemistry panel pertinent for potassium 3.2,  normal renal function, and normal liver chemistries.  Albumin 3.0 and  lipase normal at 16.  Blood cultures pending.  Urine culture clear.   CT, I reviewed this with the radiologist.  No definite diverticular  disease is seen, although the official report did mention multiple  diverticula being present.  I reviewed the CT with Dr. Janeece Riggers of  Radiology.  We also reviewed the previous Gastrografin enema and  previous CT scan.  There is significant inflammatory changes in the  pelvis without a discrete abscess.   IMPRESSION:  1. Abdominal pain, fever, and inflammatory changes on CT.  2. History of surgical pelvic abscess drainage probably of gynecologic      origin, in August 2009.  3. History of antecedent recurrent diverticulitis-like episodes in      March and April 2009.  4. Family history of complicated diverticulitis in her mother and her      sister.   DISCUSSION:  I think, it is unclear as to the origin of this patient's  infectious process.  It might be diverticulitis, although we have not  been able to establish  definite diverticular disease at the time of  surgery or previous Gastrografin enema or even on review of the current  CT scan.  Another possibility would be a recrudescence of infection from  the patient's previous abscess problem, which might have been of  gynecologic origin.  I am fairly confident that inflammatory bowel  disease such as Crohn disease is not playing a role here, based on  previous radiologic and intraoperative findings.  Urosepsis is also  ruled out and of course with her gallbladder and her appendix gone,  those etiologies are not relevant either.   RECOMMENDATIONS:  1. I would favor  ID consultation for optimization of antibiotic      therapy since the patient has spiked through the current      antibiotics.  I discussed this with Dr. Waymon Amato and he is      agreeable to call them.  2. Confirm with Gynecology to see if they have anything to offer.  I      am not sure that a formal consultation is needed as much as their      opinion over the telephone as to whether or not this is a kind of      thing that they need to look into further.  Given the fact that the      patient has had all of her reproductive organs removed, except the      right ovary and she has a vaginal cuff.  Dr. Waymon Amato indicated he      would call them to discuss this.  3. I would consider percutaneous drainage for culture if a discrete      fluid collection develops.  4. For now, I would try to avoid colonoscopic instrumentation of the      colon in case this is diverticulitis, which has perforated      recently, because such instrumentation with the associated air      insufflation would place the patient at significant risk for      further complications such as reperforation or worsening of the      perforation.  We might give consideration to an updated      Gastrografin enema if things cool off.  5. I have asked to be called by the surgical consultant to discuss      this case further  and to put our heads together since this case is      somewhat confusing in terms of etiology and optimal management.           ______________________________  Bernette Redbird, M.D.     RB/MEDQ  D:  08/05/2008  T:  08/06/2008  Job:  [id number]   cc:   Dois Davenport A. Rivard, M.D.  Anselm Pancoast. Zachery Dakins, M.D.

## 2010-12-28 NOTE — Op Note (Signed)
[name], [name]                  ACCOUNT NO.:  [id number]   MEDICAL RECORD NO.:  [id number]          PATIENT TYPE:  INP   LOCATION:  5114                         FACILITY:  [facility]   PHYSICIAN:  Anselm Pancoast. Weatherly, M.D.DATE OF BIRTH:  [birth year]   DATE OF PROCEDURE:  12/03/2008  DATE OF DISCHARGE:                               OPERATIVE REPORT   PREOPERATIVE DIAGNOSES:  1. Recurrent localized abscess or inflammatory process, left lower      quadrant.  2. Questionable diverticulitis.  3. Perforated small bowel.   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  This is all terminal ileum and resection of short segment of  sigmoid colon.   General anesthesia.   SURGEON:  Anselm Pancoast. Zachery Dakins, MD   ASSISTANT:  Almond Lint, MD.   HISTORY:  [name] is a 35 year old female who I first met  approximately 6 months ago when I was on the doctor of the week.  She  had been admitted with significant infection, had been percutaneously  drained, whether this was a diverticulitis and she had had 2 or 3  previous episodes of inflammatory process started in January 2009 and  was thought to have recurrent episodes of diverticulitis, but never on  examination with CTs had there been an obvious significant thickened,  like the typical diverticulitis and she was only 35 years old.  When she  developed significant pelvic abscesses, we were asked to see her.  She  had been on IV antibiotics.  I obtained a Gastrografin enema that showed  no evidence of any fistula or diverticulitis or acute colon issues, and  then recommended that we proceed with surgery and she was found to have  large bilateral tuboovarian abscesses and the gynecologists were  consulted.  They had seen her previously since some of her admissions  had been over at [facility] and she was about a year postpartum at  that time.  They actually performed an abdominal hysterectomy and left  salpingo-oophorectomy and removed the right tube, but we left the right  ovary and she recovered and appeared to be doing nicely.  However,  approximately 3 months later, after being on antibiotics for, I think  all total about 3-4 weeks, she developed an episode of acute pain in the  lower left abdomen with definite fluid.  I was out of town on the  Christmas holidays and the physicians admitted her, had a percutaneous  drain placed, but there never grew any bacteria and she was on  antibiotics, appeared to improve.  She was in the hospital about 3  weeks, and I saw her when I returned, but she was obviously getting  better and it was still quite concerning whether this was diverticulitis  or just what.  She completed oral antibiotics presumably for  diverticulitis and then in February, had a colonoscopy and visualization  of the terminal ileum since there had been some question or could this  be Crohn disease and no abnormalities were noted.  No diverticulum.  She  had also had her Gastrografin  enema during that hospitalization that  failed to show any evidence of diverticulitis, but there was an area  kind of in the sigmoid colon that seem to have a little bit of  resistance to the barium flow, but then it slowly opened up and that was  the test in January.  She was readmitted this past Saturday after about  a 24-hour history of again left lower quadrant pain, had a very low-  grade temperature, was not as sick as she had been in December, but was  admitted by Dr. Freida Busman, started on antibiotics, I think she was on Cipro  and Flagyl and she is allergic to PENICILLIN.  Repeat CT showed a little  area of inflammation within the small bowel in the left lower quadrant,  this is in the identical area where she had had the area in December.  I  was asked to see her on Monday, I saw her actually early Tuesday morning  after reviewing the CT and I felt that since this is basically a  recurring  sequela with similar findings, but less sick that a laparotomy  was needed and whether this will be diverticulitis or exactly what we  can not be sure.  The patient presently is afebrile.  Her white count  has not been elevated during this admission, but there was on the CT a  little couple of pockets of air that was actually extraluminal localized  in the left lower quadrant.  She is kind of vaguely tender in the left  lower quadrant, not anywhere near as ill as she was 6 months ago.  She  has been switched to Primaxin and Flagyl and has been on antibiotics.  I  stopped her subcu heparin, which she is receiving after the 6 a.m. dose  this morning and permission obtained for laparotomy.  She was very  reluctant to sign a permit for possible colostomy.  but after repeat  discussion she got to be done.  She, of course, knows I will do what is  best, but certainly hopes it is not going to be necessary for colostomy.  The patient was added to the ORF late this afternoon and it was about 9  o'clock when finally got into the operating room.  After induction of  general anesthesia by Dr. Randa Evens, endotracheal tube placed, a NG tube  was placed, Foley catheter was placed sterilely and then abdomen was  prepped with Betadine solution and draped in a sterile manner.  I made a  small incision really just above where the other midline lower abdominal  incision advanced so I could actually entered the peritoneum just above  the umbilicus and was surprised to see that she is not extensively  involved with adhesions like we feared since she had so much infection  when we operated on her 6 months ago.  I was able to elevate the  abdominal wall, did remove the old scar and opened the fascia  predominantly with cautery.  There were a couple of areas of bleeders  right along the peritoneum that required being tied with 2-0 Vicryl and  sutured with Vicryl and then the omentum that was adherent to where the   bladder flap had been elevated was freed up, so that we could get kind  of free exposure to the pelvis.  A Balfour retractor was placed and then  in feeling, you could feel an inflammatory mass to the left of midline,  and it is laying  kind of on the colon, but not involving the colon and  you could see that there was definitely infection around the bowel, but  it was fairly localized.  I did get a culture and after we kind of broke  this inflammatory process up, you could see there were 2 holes in the  ileum.  They are probably about 3 feet apart and this is in the general  area where she has had 2 drains prior to her surgeries, one before the  surgery 6 months ago and then she had a drain placed during the last  admission.  I elected to actually remove not segments of the bowel about  a little area about the size of a nickel on both of these loops and then  closed them in 2 layers, 3-0 Vicryl on the inner layer and 3-0 Lembert  sutures on the outer layer, sort of not making any mesenteric resection,  but kind of close since the little perforations were kind of on the  antimesenteric surface of both loops of small bowel.  On opening up the  small bowel, you could see that there was basically a kind of an  inflammatory process, but not really anything that made it look like  Crohn's or certainly not a tumor, etc.  This has been freed up and then  rest of these loops with small bowel, there was one little area that had  a little kind of like a little serosal area that I put a few 3-0 silk  sutures and in the area of the sigmoid colon, that I really think is  probably an inflammatory process because of the surrounding  enteroenteral fistula, but since there is always kind of been a  question, could there be a diverticular process and this courses are  very amendable location to do a little resection.  I elected to do a  resection of probably about 4 inches of the loop with a sigmoid colon   and then did the anastomosis with a GIA-55 and with a TA-60 transection  kind of the triangulation technique.  The little mesenteric defect did  not need any sutures, I did put a cross-stitch of 3-0 silk and I kind of  sutured the fatty tissue over the TA-60 anastomosis after we had  thoroughly irrigated and got good hemostasis with a few 3-0 silk  sutures.  The bowel was lying very comfortable.  I then ran mostly the  small bowel and could not find any other abnormalities, washed  throughout thoroughly and brought the omentum down over the small bowel  and this layer of sigmoid colon and enclosed the fascia with a double  loop #1 PDS with some interrupted Novofil sutures, a few 3-0 chromic of  3-0 Vicryl sutures.  We had changed gloves after each of the anastomosis  etc, and then closed the skin with staples.  The patient tolerated  procedure nicely.  Estimated blood loss was probably 100 mL and her NG  tube is in good position.  We will keep the Foley for probably 36 hours.  I want to keep the NG tube until she definitely has  good bowel function and she has a PICC line.  She possibly would not  need hyperalimentation, but if there is any question, we will start that  also.  I am going to keep her on the broad antibiotics at least for few  days, awaiting the results of this culture and I think that she  can  return to her room on 5100.      Anselm Pancoast. Zachery Dakins, M.D.  Electronically Signed     WJW/MEDQ  D:  12/03/2008  T:  12/04/2008  Job:  [id number]

## 2010-12-28 NOTE — Consult Note (Signed)
NAMEEMILE, RINGGENBERG NO.:  [id number]   MEDICAL RECORD NO.:  [id number]          PATIENT TYPE:  INP   LOCATION:  9306                          FACILITY:  [facility]   PHYSICIAN:  Graylin Shiver, M.D.   DATE OF BIRTH:  1975/11/19   DATE OF CONSULTATION:  12/10/2007  DATE OF DISCHARGE:                                 CONSULTATION   REFERRING PHYSICIAN:  We were asked to see Ms. Brookens today in  consultation for severe abdominal pain by Dr. Jaymes Graff.   HISTORY OF PRESENT ILLNESS:  This is a 34 year old female who was  diagnosed with sigmoid diverticulitis on October 29, 2007.  After  discharge from the hospital she was unable to finish her oral  medications because she could not tolerate the Flagyl.  She tells me  that she used to have an IUD which her body rejected prior to her last  hospitalization, and no traveling IUD showed up on her last CT scan.  The patient does have two ovarian cysts.   She tells me that she has two pains, one in her upper abdomen, one in  her lower abdomen.  Both started Sunday.  She describes no bowel  movements yesterday or today.  She has certainly seen no melena or  hematochezia.  She also describes one episode of vomiting fluid  yesterday.  She says this was not hematemesis.  She tells me that she  has had a fever of 101 and her pain is severe.   PAST MEDICAL HISTORY:  1. Migraine headaches.  2. The diverticulitis in March 2009.  3. Her ovarian cysts.   CURRENT MEDICATIONS:  Claritin and occasional ibuprofen, but she does  not use ibuprofen to excess.   ALLERGIES:  PENICILLIN and PHENERGAN.  She also has a sensitivity to  Flagyl.   REVIEW OF SYSTEMS:  Positive for 101-degree fever.   SOCIAL HISTORY:  Negative for drugs and tobacco.  She tells me she  occasionally drinks an alcoholic beverage.   FAMILY HISTORY:  Negative for pancreatic cancer.   PHYSICAL EXAM:  She is alert and oriented.  She does appear to be in  pain.  Her  temperature is 98.5, pulse 127, respirations 24, blood pressure is  96/61.  Her heart has a tachy rate with regular rhythm.  No murmurs, rubs or  gallops appreciated.  LUNGS:  Clear to auscultation anteriorly.  ABDOMEN:  Soft but very tender throughout.  Positive bowel sounds.  In her lower extremities she has no edema.   LABS:  A white count of 17.3, that is up from 9.0 yesterday, hemoglobin  10.5, hematocrit 30.1, platelets 254,000.  BMET is significant for a  potassium of 3.1, sodium 132, glucose 119, BUN is 7, creatinine is 0.9.  Abdominal x-ray was negative.  Transvaginal ultrasound shows moderate  amount of complex fluid in the pelvic cul-de-sac.   ASSESSMENT:  Dr. Wandalee Ferdinand has seen and examined the patient, collected  a history and reviewed her chart.  His impression is that this is a  likely recurrence of her diverticulitis.  We are concerned for abscess  or perforation and will order a stat CT scan.  We will also put her on  MiraLax and continue antibiotics.   Thank you very much for this consultation.      Stephani Police, PA    ______________________________  Graylin Shiver, M.D.    MLY/MEDQ  D:  12/10/2007  T:  12/10/2007  Job:  [id number]   cc:   Naima A. Normand Sloop, M.D.  Fax: [phone]   Everardo All. Madilyn Fireman, M.D.  Fax: [phone]

## 2010-12-28 NOTE — Consult Note (Signed)
[name], [name]                  ACCOUNT NO.:  [id number]   MEDICAL RECORD NO.:  [id number]          PATIENT TYPE:  INP   LOCATION:  1228                         FACILITY:  [facility]   PHYSICIAN:  Alfonse Ras, MD   DATE OF BIRTH:  1976-02-07   DATE OF CONSULTATION:  12/10/2007  DATE OF DISCHARGE:                                 CONSULTATION   REFERRING PHYSICIAN:  Crist Fat. Rivard, MD   HISTORY OF PRESENT ILLNESS:  The patient is a very pleasant 35 year old  black female who was discharged from the hospital approximately 75-month  ago after an episode of acute diverticulitis treated with IV  antibiotics.  The patient was not able to complete her home course of  antibiotics secondary to intolerance to p.o. Flagyl.  The patient  presented last week for her annual exam with the GYN and IUD was not  verified.  Therefore, she underwent ultrasound on Friday, which showed  pelvic fluid and ovarian cyst.  She presented yesterday with worsening  lower abdominal pain and was seen and admitted by the GYN Service for  presumed ovarian cyst pathology.  However, after admission, the patient  was noted to have increasing fevers to 102 and a white count that had  increased from 9.4 to 17,000.  The patient also had minimal urine output  without a Foley catheter placed during her first 24 hours here.  She was  started on Cipro and Flagyl.  The patient underwent CT scan of the  abdomen after complaining of worsening abdominal pain, which was  consistent with acute diverticulitis, a small amount of pelvic fluid,  and some minimal amount of free intra-abdominal fluid.  No evidence of  perforation.  After discussion with Dr. Estanislado Pandy, I think that the patient  is best care for by transfer to [facility] in the step-down  unit for continuous vital sign monitoring.  I have also discussed this  with the [facility] to which she was admitted her  last hospitalization.   PAST  MEDICAL HISTORY:  Significant only as above and for seasonal  allergies.   MEDICATIONS:  Include Cipro and Flagyl as above and a PCA Dilaudid pump.   She does report having a PENICILLIN allergy.   FAMILY HISTORY:  Significant for mother who has had diverticulosis in  the past as well.   PHYSICAL EXAM:  GENERAL: She is an age-appropriate morbidly obese black  female in moderate distress.  VITAL SIGNS: Her blood pressure is 116/70, heart rate is 132, and  respiratory rate is 18.  HEENT EXAM: Benign.  Normocephalic and atraumatic.  Pupils are equal,  round, and reactive to light.  LUNGS: Clear to auscultation and percussion x2.  HEART: Heart is regular rhythm with a rate of 132.  ABDOMEN: Moderately obese with tenderness in the suprapubic region,  particularly on the left greater than right and some minimal tenderness  in the upper abdomen.  RECTAL EXAM: Not performed.  EXTREMITIES:  Show no clubbing, cyanosis, or edema.  Foley catheter is  in place.   IMPRESSION:  Recurrent,  continued acute diverticulitis without evidence  of perforation.   PLAN:  Transfer to [facility] in the step-down unit.  Continue  IV hydration, IV antibiotics, and close clinical follow-up.      Alfonse Ras, MD  Electronically Signed     KRE/MEDQ  D:  12/10/2007  T:  12/11/2007  Job:  [id number]   cc:   [facility]

## 2010-12-28 NOTE — H&P (Signed)
NAMEPORSCHE, NOGUCHI                  ACCOUNT NO.:  [id number]   MEDICAL RECORD NO.:  [id number]          PATIENT TYPE:  INP   LOCATION:  9306                          FACILITY:  [facility]   PHYSICIAN:  Naima A. Dillard, M.D. DATE OF BIRTH:  [birth year]   DATE OF ADMISSION:  12/09/2007  DATE OF DISCHARGE:                              HISTORY & PHYSICAL   The patient is a 35 year old black female gravida 1, para 1-0-0-2, who  is not pregnant and presents with severe acute left lower quadrant pain  with onset at noon today.  I received a telephone call from the patient  at approximately 12:30 and initially questioned, what are the symptoms  of ovarian cyst rupture.  She reports an expelled Mirena sometime over  the last 2 months.  She was seen at our office at [facility] on Tuesday, December 04, 2007, for an annual exam with Okey Regal  _______, the family nurse  practitioner and certified nurse midwife, had a Pap which was negative  for intraepithelial lesions or malignancies.  Mirena IUD strings were  not visualized, and the patient was brought back to the office on  Friday, December 07, 2007 for an ultrasound.  IUD was not seen, and a left  ovarian cyst was noted per patient's report.  She did not have any pain  until today.  She has had positive nausea and vomiting since arrival to  the hospital.  She reports pain in the left lower quadrant, 10/10.  Her  last p.o. intake was 10:00 a.m.  She has had a normal bowel movement  today.  Denies fever, shortness of breath, headache, UTI signs or  symptoms, abnormal discharge or vaginal bleeding, diarrhea or  constipation or other complaints.  She states it feels like my period  is starting.  It is not a cramping feeling but feels like bleeding is  about to start.  She has had recent treatment with Z-Pak for sinusitis.  During her in [facility], she received initially 25 mg of Demerol which brought  her pain down to a 7/10.  She also received Zofran, an 8 mg oral  dissolving tablet at the same time.  Following that, continued to have  pain.  Was given 60 mg of Toradol IM with little to no relief.  Still  reported pain 7/10 and has since had Dilaudid 2 mg p.o. with little  relief, as well.   ALLERGIES:  1. She does report a PENICILLIN allergy.  2. She also reports her mother having a severe reaction to Phenergan,      and she declines receiving that.   PRESENT MEDICATIONS:  1. She is on Claritin for seasonal allergies.  2. She did take 600 mg of Motrin at approximately 12:30 or 12:45 today      as instructed before coming to [facility].   PAST MEDICAL HISTORY:  1. She was diagnosed with diverticulitis in March of 2009.  She had      two CT scans at that time; one was on October 29, 2007 and the      following  one was on November 01, 2007.  She has not had any GI follow      up since.  2. She reports the seasonal allergies.   SURGICAL HISTORY:  She had a laparoscopic cholecystectomy in which she  thinks was 2002.  Her last menstrual period was November 15, 2007.   FAMILY HISTORY:  Significant for hypertension.   SOCIAL HISTORY:  She lives with her mother.  Denies any domestic issues.  Is single.  Negative ETOH, smoking or illicit drug use.   OBJECTIVE:  VITAL SIGNS:  Her vital signs since admission revealed a  blood pressure of 114/72, pulse 75, respirations 18 and temperature was  98 degrees.   LABORATORY DATA:  She had a quantitative HCG which was less than 2.  Her  CBC showed a white count of 9.4, hemoglobin was 12.7, hematocrit 36.5  and platelets were 324.  A pelvic ultrasound was obtained.  It was both  transvaginal and transabdominal.  Findings were a normal appearing  uterus and adnexa.  Uterus measured 7.7 x 3.9 x 5.0 cm with an  endometrium of 5 mm.  The right ovary measured 3.0 x 1.7 x 2.3 cm; left  ovary 3.3 x 2.1 x 2.4 cm with a dominant follicle measuring 2 cm in her  left ovary.  Normal Doppler signal to bilateral  ovaries.  She had a   small to moderate amount of complex fluid within the pelvis which was  identified in pelvic cul-de-sac, questionable small amount of hemorrhage  or infected ascites, correlating with the patient's symptomatology.  After consulting with Dr. Su Hilt because the patient had not had an x-  ray to rule out IUD perforation, an abdominal flat plate ultrasound was  obtained.  Impression was no acute chest or abdominal findings.  She had  a nonobstructive gas pattern, surgical ________ right upper quadrant,  presumed cholecystectomy, transitional anatomy L5 with partial  sacralization on the left.  No visible masses or abnormal  calcifications, and cardiac and mediastinal silhouette were  unremarkable.  Lung fields clear.  No bony abnormality and no free air  beneath the diaphragm.  When she was seen in March and diagnosed with a  diverticula, she did have a Clostridium difficile culture which was  negative.  RPR at that time was nonreactive.  She had GC and chlamydia  cultures which were negative at that time, as well, and which were not  repeated today.  Her TSH at that time was 3.657 which was within normal  range.   PHYSICAL EXAMINATION:  GENERAL:  Alert and oriented x3.  Acute distress  secondary to abdominal pain.  CARDIOVASCULAR:  Regular rate and rhythm without murmur.  LUNGS:  Clear to auscultation bilaterally.  ABDOMEN:  Guarded.  She has hypoactive bowel sounds in four quadrants.  No rebound but tender, soft.  PELVIC:  No cervical motion tenderness.  No lesions or abnormalities.  Mild uterus bilateral adnexal tenderness.  EXTREMITIES:  Within normal limits.   ASSESSMENT:  1. Abdominopelvic pain of unknown origin with differential diagnoses      of ruptured ovarian cyst and diverticulitis.  2. Non-pregnant.  3. CBC normal.   PLAN:  Per Dr. Normand Sloop, the patient is to be admitted for 23-hour  observation with Dr. Normand Sloop as attending physician.  She is going to  have Dilaudid PCA  per high-dose protocol.  She is to have a CBC with  differential and CMP in the a.m.  She is having clears this evening  and  to be n.p.o. after midnight.  Activity as tolerated.  She is going to be  receiving IV fluids of D5 LR at 125 per hour.  Strict I&Os.  She is  going to have a UA for culture and sensitivity.  She will have a GI  consult in the a.m., and if no relief, plan MRI for further assessment.      Candice [place], CNM      Naima A. Normand Sloop, M.D.  Electronically Signed    CHS/MEDQ  D:  12/09/2007  T:  12/09/2007  Job:  [id number]

## 2010-12-28 NOTE — Discharge Summary (Signed)
[name], [name]                  ACCOUNT NO.:  [id number]   MEDICAL RECORD NO.:  [id number]          PATIENT TYPE:  INP   LOCATION:  3704                         FACILITY:  [facility]   PHYSICIAN:  Beckey Rutter, MD  DATE OF BIRTH:  05-02-1976   DATE OF ADMISSION:  08/04/2008  DATE OF DISCHARGE:                               DISCHARGE SUMMARY   PRIMARY CARE PHYSICIAN:  Lonia Blood, MD   CHIEF COMPLAINT:  Abdominal pain.   BRIEF HISTORY OF PRESENT ILLNESS AND HOSPITAL COURSE:  A 35 year old  [place] female with multiple hospitalizations with diagnosis of  diverticulitis and pelvic abscesses.  She was admitted with similar  presentation of abdominal pain.  The patient was on the antibiotic as  per the Infectious Disease recommendation.  Currently, she is on  gentamicin and Zosyn.  The patient had a CT abdomen and pelvis which was  suggestive of diverticulitis.  The patient has a repeat CT which showed  pelvic abscess, 4 x 6 cm.  The plan is to discuss with Surgery for  drainage and to consider maybe Interventional Radiology for drainage of  this abscess.  Currently, the patient has been n.p.o. for more than a  week and she was started on TNA.   HOSPITAL CONSULTATIONS:  1. Gastroenterology consultation.  2. Infectious Disease.  3. [facility].  4. OB/GYN, since the patient has history of GYN and abdominal      procedures.   RESULTS AND IMAGING:  Today, her sodium is 133, potassium 4.2, chloride  is 101, bicarb 26, glucose 101, BUN is 5, and creatinine 0.65.  White  blood count is 8.9, hemoglobin is 10.4, hematocrit is 31.5, and platelet  count is 409.   Abdominal x-ray on August 04, 2008, impression was no acute intra-  abdominal process.   August 05, 2008, the patient had CT abdomen and pelvis with the  impression as:  1. Perihepatic ascites noted.  Abdomen is otherwise negative.  2. Bibasilar atelectasis, unchanged on the right lung  more than the      left.  Developing pneumonia in the right lung base is not excluded.   August 05, 2008, CT pelvis with impression as finding most compatible  with sigmoid diverticulitis with pelvic ascites and extensive  infiltration of fat in the pelvis noted.  No abscess was identified at  this time.   August 08, 2008, the patient had chest x-ray after PICC line placement  which is showing PICC line in the lower SVC.   August 10, 2008, the patient had CT abdomen and pelvis.  1. CT abdomen impression is increased small bowel dilatation and lower      abdomen.  See pelvis CT report below.  2. Increased bibasilar atelectasis and small bilateral pleural      effusion.   August 10, 2008 pelvis CT scan is showing:  1. Persistent inflammatory process in the anterior pelvis, which      involved multiple distal small bowel loops.  It is now causing a      low-grade small-bowel obstruction.  2. Decreased wall thickening of the sigmoid colon since prior exam.  3. Probable 4 x 6 cm extraluminal fluid collection in the right      pelvis, suspicion for abscess.   DISCHARGE DIAGNOSES:  1. Diverticulitis.  2. A 4 x 6 cm pelvic abscess.  3. Low attenuation small-bowel obstruction.  4. History of recurrent intra-abdominal abscesses.  5. Possible pneumonia.  6. Hypokalemia during the hospital stay.   DISCHARGE MEDICATIONS:  Discharge medication will be dictated on the day  of actual discharge.   CURRENT HOSPITAL PLAN:  The patient has pelvic abscess 4 x 6 cm.  We are  waiting for the surgical team input.  I suspect this abscess could be  tackled by drainage only with Interventional Radiology, but we will  discuss with the surgical team the options of intervention at this time.  We are going to follow the Infectious Disease and Gastroenterology input  as well.      Beckey Rutter, MD  Electronically Signed     EME/MEDQ  D:  08/11/2008  T:  08/11/2008  Job:  [id number]

## 2010-12-28 NOTE — Consult Note (Signed)
[name], [name]                  ACCOUNT NO.:  [id number]   MEDICAL RECORD NO.:  [id number]          PATIENT TYPE:  INP   LOCATION:  5114                         FACILITY:  [facility]   PHYSICIAN:  Antonietta Breach, M.D.  DATE OF BIRTH:  1975-11-20   DATE OF CONSULTATION:  03/31/2008  DATE OF DISCHARGE:                                 CONSULTATION   REASON FOR CONSULTATION:  Depression.   HISTORY OF PRESENT ILLNESS:  [name] is a 35 year old female  admitted to the [facility] on March 24, 2008 for abdominal pain.   [name] had an abscess which required removal of her ovaries and her  fallopian tubes.   [name] describes a full-week period of depressed mood, decreased  energy, difficulty concentrating, insomnia, anhedonia. She has not had  any suicidal thoughts.  She has not had thoughts of harming others.  She  has had no hallucinations or delusions.   She has intact memory and orientation function.  She is socially  appropriate and cooperative with staff.   Her OB/GYN physician has been very concerned about her depression, and  [name] is not resistant to talk about it. She describes the ongoing,  heavy stress of being a single parent with two girl twins who are 84  years old.  Please see the social history.   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 Since that time she has not  fully recovered from depression; however, it did improve for the most  part until 4 weeks ago.   [name] has no history of mania or suicide attempts.  She has not  undergone psychiatric care.   FAMILY PSYCHIATRIC HISTORY:  None known.   SOCIAL HISTORY:  [name] has twins, 56 years old. Her mother and  partially relieve her at times. The father of the twins only supplies  financial child support.  [name] has occasional alcohol.  No illegal  drugs.  She has no complications from her alcohol use.   She lives  alone.   PAST MEDICAL HISTORY:  Please see the history of present illness.   MEDICATIONS:  The MAR is reviewed.  [name] he is not on any  psychotropic medications.   ALLERGIES:  PENICILLIN.   LABORATORY DATA:  Sodium 138, potassium 3.4, BUN 21, creatinine 0.48,  glucose 93, SGOT 14, SGPT 14, WBC 3.5, hemoglobin 7.8, platelet count  347, magnesium within normal limits.   REVIEW OF SYSTEMS:  Constitutional, head, eyes, ears, nose and throat,  mouth, neurologic, psychiatric, cardiovascular, respiratory,  gastrointestinal, genitourinary, skin, musculoskeletal, hematologic,  lymphatic, endocrine, metabolic all unremarkable.   PHYSICAL EXAMINATION:  VITAL SIGNS:  Temperature 98.6, pulse 82,  respiratory rate 18, blood pressure 125/89, O2 saturation on room air  98%.  GENERAL APPEARANCE:  [name] is a young female sitting up in her  hospital bed with no abnormal involuntary movements.   MENTAL STATUS EXAM:  [name] is alert.  She is oriented to all spheres.  Her eye contact is good.  Her attention span is within normal limits.  Concentration is mildly decreased.  Affect is constricted.  Mood is  depressed.  Fund of knowledge and intelligence are within normal limits.  Memory is intact to immediate, recent and remote.   Speech involves normal rate and prosody.  Thought process is logical,  coherent, goal directed.  No looseness of associations, thought content,  no thoughts of harming herself, no thoughts of harming others, no  delusions, no hallucinations, insight is intact, judgment is intact.   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  AXIS II:  Deferred.  AXIS III:  See past medical history.  AXIS IV:  Primary support group general medical.  AXIS V:  55.   [name] is not at risk to harm herself or others.   She agrees to call  emergency services immediately for any thoughts of harming herself,  thoughts of harming others, or distress.   The undersigned provided ego supportive psychotherapy and education.   The indications, alternatives and adverse effects of Celexa were  discussed with the patient for anti depression, also Benadryl was  discussed for anti insomnia (the patient has had good response to  Benadryl for insomnia in the past).   [name] understands the above and wants to proceed as follows.   RECOMMENDATIONS:  1. Would start Celexa at 10 mg q.a.m., would then increase to 20 mg      q.a.m. in 4 days as tolerated as the initial trial dose.  2. Benadryl 25 to 50 mg p.o. q.h.s. p.r.n. insomnia.  3. No driving if drowsy.  4. Would have the case manager set Ms. Zillmer up with outpatient      psychiatric care as well as outpatient counseling as soon as      possible within the first week of discharge.      Antonietta Breach, M.D.  Electronically Signed    JW/MEDQ  D:  03/31/2008  T:  03/31/2008  Job:  [id number]

## 2010-12-28 NOTE — H&P (Signed)
[name], KIGER NO.:  [id number]   MEDICAL RECORD NO.:  [id number]          PATIENT TYPE:  INP   LOCATION:  1228                         [facility]ILITY[facility]   PHYSICIAN:  Eduard Clos, MDDATE OF BIRTH:  1975-11-08   DATE OF ADMISSION:  12/10/2007  DATE OF DISCHARGE:                              HISTORY & PHYSICAL   CHIEF COMPLAINT:  Patient transferred from [facility] for  further management of possible sepsis.   HISTORY OF PRESENT ILLNESS:  A 35 year old female with recent treatment  for acute diverticulitis last month presented to the [facility] yesterday, on December 09, 2007.  She had started having acute onset  of left lower quadrant pain.  Patient states that she was feeling well  until yesterday afternoon when she started having sudden, gripping pain  in her left lower quadrant.  It was constant.  Has not gotten any relief  except when she gets the pain relief medication.  Patient did have  nausea and vomiting twice yesterday.  Today, she only had nausea.  She  denies any diarrhea.  The last time she moved her bowels was yesterday  morning.  The patient initially went to [facility], as she  thought she may have had an ovarian cyst rupture, as she was diagnosed  with some ovarian cysts last week when she had followup with her  gynecologist.   At the [facility], the patient's pain was worsening with the  possibility of acute abdomen.  Patient had a CAT scan of the abdomen and  pelvis done, and a surgical consultation was obtained with Dr. Colin Benton.  Dr. Colin Benton reviewed the CAT scan and called [place] and told that the patient  has diverticulitis and at the time was very tachycardic and would need  to be transferred to ICU.  Patient at this time is being transferred for  further management of her possible sepsis secondary to diverticulitis.  Patient states that she still has pain in the left lower quadrant, which  is  currently relieved only with Dilaudid.  Denies any nausea or diarrhea  now.  Denies any chest pain, shortness of breath.  Denies any weakness  of the limbs or any loss of consciousness or dizziness.  Abdominal pain,  she states, is most concentrated on the left lower quadrant.  It is a  constant, gripping pain, which she states is horrible.   PAST MEDICAL HISTORY:  Recent diverticulitis last month.   PAST SURGICAL HISTORY:  Gallbladder surgery.   MEDICATIONS PRIOR TO ADMISSION:  Patient states that she took ibuprofen  for her pain.  Presently, she is on IV Cipro and Flagyl and IV Dilaudid  PCA pump along with a liter of Ringer's lactate fluids.   ALLERGIES:  PENICILLIN.   FAMILY HISTORY:  Significant for patient's father having carotid  disease.   SOCIAL HISTORY:  Patient denies smoking cigarettes and drinks alcohol  only occasionally.  Denies any drug abuse.   REVIEW OF SYSTEMS:  As per the history of present illness, nothing else  significant.  PHYSICAL EXAMINATION:  Patient examined at bedside.  Not in acute  distress.  VITAL SIGNS:  Blood pressure 105/53, pulse 112 per minute, temperature  max 102.7, respirations 18 per minute, O2 sats 100%.  HEENT:  Anicteric.  No pallor.  CHEST:  Bilateral air entry present.  No rhonchi.  No crepitation.  HEART:  S1 and S2 heard.  ABDOMEN:  Soft.  Diffuse tenderness, more on the left lower quadrant  with guarding and mild rebound tenderness.  CNS:  Patient is alert and oriented to time, place, and person.  Moves  upper and lower extremities.  Strength is 5/5.  Peripheral pulses are  felt.  No edema.   LABS:  Ultrasound of the pelvis shows dominant follicle within the left  ovary with normal appearance of the uterus and adnexa.  A small to  moderate amount of complex fluid within the pelvis.  Question a small  amount of hemorrhage or infected ascites.   CT abdomen, no evidence of abdominal abscess.  Increased bibasilar   atelectasis.   CT pelvis, a small amount of free fluid in the inferior pelvis.  No  discrete abscess identified.  Further interval improvement in sigmoid  diverticulitis.   ASSESSMENT:  1. Sepsis from diverticulitis.  2. Acute diverticulitis with possible microperforatons.   PLAN:  1. Admit patient to team F, [facility], to ICU.  2. Will place patient on increased IV fluids with normal saline with      potassium.  Will place also on pain relief medication.  Will add      Azactam, as patient is allergic to PENICILLIN, with Cipro and      Flagyl.  Patient already has had blood cultures.  Will follow blood      cultures.  Will place PICC line if needed.  Will get a portable      chest x-ray.  Surgical followup.      Eduard Clos, MD  Electronically Signed     ANK/MEDQ  D:  12/10/2007  T:  12/10/2007  Job:  [id number]

## 2010-12-28 NOTE — Consult Note (Signed)
NAMELARIZA, COTHRON                  ACCOUNT NO.:  [id number]   MEDICAL RECORD NO.:  [id number]          PATIENT TYPE:  INP   LOCATION:  5114                         FACILITY:  [facility]   PHYSICIAN:  Antonietta Breach, M.D.  DATE OF BIRTH:  Aug 28, 1975   DATE OF CONSULTATION:  04/01/2008  DATE OF DISCHARGE:                                 CONSULTATION   Ms. Larivee had insomnia.  Her mood is mildly improved.  She does have hope  and some interest.  She has no thoughts of harming herself.  She has no  hallucinations or delusions.  She has no thoughts of harming others.   She is socially appropriate and cooperative.  Her energy is still  decreased.   REVIEW OF SYSTEMS:  No nausea, diarrhea, or other adverse  gastrointestinal effects.  Ms. Nesser states that she has been having  loose stools since the operation.   LABORATORY DATA:  Sodium 139 hemoglobin is 8.   EXAMINATION:  VITAL SIGNS:  Temperature 98.4, pulse 85, respiratory rate  18, blood pressure 125/86, O2 saturation on room air 97%.  MENTAL STATUS EXAM:  Ms. Ventress is alert.  She is oriented to all spheres.  Her attention span is normal.  Her concentration is mildly decreased.  Mood is mildly depressed.  Affect is mildly constricted.  Memory is  intact to immediate, recent, and remote.  Speech involves normal rate  and prosody without dysarthria.  Speech volume is soft.  Thought process  logical, coherent, goal-directed.  No looseness of associations.  Thought content, no thoughts of harming herself, no thoughts of harming  others, no delusions, no hallucinations.  Insight is intact.  Judgment  is intact.   ASSESSMENT:  293.83 Mood disorder not otherwise specified (idiopathic as  well as general medical factors such as anemia), depressed.  296.33 Major depressive disorder, recurrent, severe.   Ms. Sciara is not at risk to harm herself or others.  She agrees to call  emergency services immediately for any thoughts of harming herself,  thoughts of harming others or distress.   The undersigned provided ego supportive psychotherapy and education.   The use of Benadryl for insomnia as well as alternatives and side  effects were discussed with the patient.  She would like to proceed with  Benadryl for insomnia because it has worked for her in the past without  adverse effects.  She agrees to not drive if drowsy.   RECOMMENDATIONS:  1. Would add Benadryl 25-50 mg nightly p.r.n. insomnia.  2. Tomorrow, would increase Ms. Heady Celexa  to 20 mg q. a.m. for      her initial trial dose for anti depression.  3. Ms. Lites agrees to a followup course with one of the intensive      outpatient programs in Psychiatry.  These can be found at [facility],      [facility], [facility], would ask the case manager      to set this up.      Antonietta Breach, M.D.  Electronically Signed     JW/MEDQ  D:  04/01/2008  T:  04/01/2008  Job:  [id number]

## 2010-12-28 NOTE — Consult Note (Signed)
[name], DIGGS                  ACCOUNT NO.:  [id number]   MEDICAL RECORD NO.:  [id number]          PATIENT TYPE:  INP   LOCATION:  2622                         FACILITY:  [facility]   PHYSICIAN:  Adolph Pollack, M.D.DATE OF BIRTH:  05-Sep-1975   DATE OF CONSULTATION:  03/25/2008  DATE OF DISCHARGE:                                 CONSULTATION   PHYSICIAN REQUESTING CONSULTATION:  Dr. Allena Katz from [facility].   REASON:  Abdominal pain and pelvic fluid collections.   HISTORY OF PRESENT ILLNESS:  [name] is a 35 year old female who had an  abrupt onset of left lower quadrant pain at 3:00 p.m. on March 24, 2008.  She has had episodes similar to this in the recent past and has  been diagnosed with diverticulitis.  She has had two previous  hospitalizations for that one in March of this year and one in late  April of this year.  She states these symptoms were accompanied by  nausea, vomiting, and fever.  She came to the emergency department for  evaluation.  She was noted to have a mild leukocytosis and be febrile.  She underwent a CT scan, which demonstrated what appeared to be a pelvic  abscess involving the right hemipelvis and also tracking to the left  side.  She did not have IV contrast.  I was then asked to see her by Dr.  Allena Katz.  Of note was that she had percutaneous drainages of multiple  abscesses and felt to be secondary to diverticular disease in late March  early May of this year.  It was recommended to her that she undergo a  partial colectomy, however, she declined.   Currently, she had been started on an IV Invanz.   PAST MEDICAL HISTORY:  1. Sigmoid diverticulitis.  2. Pelvic abscesses.  3. Cholelithiasis.  4. Ovarian cyst.  5. Gastroesophageal reflux disease.   PREVIOUS OPERATION:  Laparoscopic cholecystectomy.   ALLERGIES:  PENICILLIN.   MEDICATIONS:  Prilosec and Vicodin p.r.n. for the pain.   SOCIAL HISTORY:  She is working.  NO tobacco use and no alcohol  use.   FAMILY HISTORY:  Notable for heart disease, hypertension, diabetes, and  also diverticulitis.   REVIEW OF SYSTEMS:  CARDIOVASCULAR:  She denies heart disease or  hypertension.  PULMONARY:  She denies asthma and pneumonia.  GI:  Denies peptic hepatitis.  No melena or hematochezia.  GU:  No kidney stones or dysuria.  ENDOCRINE:  No diabetes or hypercholesterolemia.  HEMATOLOGIC:  No bleeding disorders or blood clots.   PHYSICAL EXAMINATION:  GENERAL:  An ill-appearing female but is  pleasant.  VITAL SIGNS:  Temperature is 102.3, blood pressure is 123/74, and pulse  132.  RESPIRATORY:  Breath sounds are equal and clear and respirations are  unlabored.  CARDIOVASCULAR:  Increased rate and regular rhythm.  ABDOMEN:  Soft with small well-healed scars.  She has a mild tenderness  in the upper abdomen and tenderness with guarding in the suprapubic  region and left lower quadrant without obvious mass.  MUSCULOSKELETAL:  Good muscle tone.  No  edema.   LABORATORY DATA:  Notable for white cell count of 12,000 with a leftward  shift and hemoglobin of 12.3.  Lipase normal.  Urinalysis negative.  Urine pregnancy negative.   CT scan was reviewed.   IMPRESSION:  Recurrent pelvic abscess most likely due to recurrent  sigmoid diverticulitis versus possible to ovarian abscess (The injection  of her previous drainage catheters did suggest opacification of  fallopian tubes and ovary) versus inflammatory bowel disease.   PLAN:  Agreed with broad-spectrum antibiotics.  We will request an  interventional radiology consult for consideration of pelvic drainage  acutely.  I told her that if this was able to be treated nonoperatively  this time, and certainly, I would strongly recommend the elective  colectomy.  However, she may end needing a colectomy, colostomy, and  open cyst drainage this admission.      Adolph Pollack, M.D.  Electronically Signed     TJR/MEDQ  D:  03/25/2008  T:   03/25/2008  Job:  [id number]

## 2010-12-28 NOTE — Discharge Summary (Signed)
[name], [name]                  ACCOUNT NO.:  [id number]   MEDICAL RECORD NO.:  [id number]          PATIENT TYPE:  INP   LOCATION:  4155                         FACILITY:  [facility]   PHYSICIAN:  Altha Harm, MDDATE OF BIRTH:  March 02, 1976   DATE OF ADMISSION:  08/04/2008  DATE OF DISCHARGE:  08/28/2008                               DISCHARGE SUMMARY   DISCHARGE DISPOSITION:  Home.   FINAL DISCHARGE DIAGNOSES:  Include the following:  1. Diverticulitis.  2. A 4 x 6-cm pelvic abscess which did not drain any purulent      material.  3. Low-attenuation small bowel obstruction, resolved.  4. History of recurrent intraabdominal abscesses.  5. Hypokalemia during the hospital stay, now resolved.  6. Recurrent fevers.  7. Subcutaneous abdominal hematoma, resolving.   DISCHARGE MEDICATIONS:  Include the following:  1. Augmentin 875 mg p.o. b.i.d. x5 days.  2. Flagyl 500 mg p.o. t.i.d. x5 days.  3. MiraLax 17 g in 8 ounces of water daily p.r.n. for hardened stool.  4. Phenergan 12.5 mg p.o. every 8 hours p.r.n. nausea.  5. Ibuprofen 600 mg p.o. every 6 hours p.r.n. pain.   CONSULTANTS:  1. Dr. Zachery Dakins, surgery.  2. Infectious diseases.  3. Interventional radiology.  4. Dr. Matthias Hughs, gastroenterology.   PROCEDURES:  Please refer to the prior discharge summaries for  procedures up until August 26, 2008, however, major procedures during  this hospitalization included:  1. Insertion of JP drain into abscess by interventional radiology.  2. PICC line placement.   RADIOLOGIC STUDIES:  Since August 26, 2008, include Gastrografin enema  which showed minimal sigmoid diverticulosis coli.  No evidence of acute  diverticulitis seen.  No perforation, obstruction, mass, or fistula  demonstrated.   Please refer to the prior discharge summaries for summaries of  diagnostic studies performed since August 05, 2008.   HOSPITAL COURSE:  1. Please refer to the prior discharge summaries  for hospital course      up until this point, however, in a brief summary this patient was      admitted with complaint of abdominal pain.  The patient has      significant diverticular and pelvic abscess history with multiple      surgeries in the past.  The patient was admitted with a diagnosis      of acute diverticulitis and started on ciprofloxacin and Flagyl.      The patient also was considered to have a possible pneumonia based      on the CT findings and was started on antibiotic coverage also for      a possible pneumonia.  Infectious disease was consulted and she was      changed over to gentamicin and Zosyn per their recommendations.  A      followup CT showed that the patient had findings consistent with a      pelvic abscess, 4 x 6 cm, and interventional radiology was      consulted for a drain to be placed.  A JP drain was placed by  interventional radiology and the fluid from the abscess was sent      for studies which revealed no bacteria, no WBCs, and there is no      bacteria growing.  The culture and Gram stains were negative for      infection.  The patient was continued on Zosyn and then the      antibiotics were changed by surgery to imipenem.  The patient had      been started on TNA via her PICC line.  For nutrition, was kept      n.p.o.  The patient was slowly started on a clear liquid diet and      advanced to low-residue diet which the patient tolerated throughout      the rest of her hospital stay.  Despite all the antibiotic      recommendations and changes, the patient still continued to have      recurrent fevers without any focus of infection.  Particularly in      light of the fact that the drainage from the abscess revealed no      bacteria and WBCs.  However, infectious disease had made a      recommendation to change her to Augmentin and to empirically add      Flagyl.  With this addition, her fevers began to resolve and      currently the patient  has had some low-grade temperatures.      However, clinically the patient appears well and she has had a de-      escalation of her white count.  Although the patient clinically      appeared well, her hospitalization was prolonged secondary to her      recurrent fevers and her elevation of her white blood cell count.      With the addition of the Flagyl, her fevers have continued to come      down and the patient's white blood cell has dropped down from a      high of 19.5 down to a leukopenic state of 3.3 and is currently at      10.9.  In terms of her fever, the patient has had a low-grade temp      of 100.1 in the last 24 hours, however, shows no signs and symptoms      of infections.  The patient did have both a PICC line and the drain      in place and the PICC line will be removed and sent for a culture.      A dose of vancomycin has been given to the patient prior to removal      of PICC line.  I spoke with Dr. Zachery Dakins and he will follow up      with the cultures from the PICC line when he sees the patient in      the office.  The patient has also been instructed both by myself      and Dr. Zachery Dakins to call and return to see him if she has fevers      that go above 101.5.  The patient has tolerated her diet and has      had formed stools.  She has had no diarrheal stools at this point.      The patient has had some loose stools since she has had the      Gastrografin enema, however, today she had a formed stool prior to  discharge.  The patient still continues to have some tenderness in      the left lower quadrant, however, this appears to be over the area      where she was receiving Lovenox and has a small abdominal wall      hematoma.  The patient is ambulatory.  She has had no nausea and      vomiting in the last 48 hours.  The patient still complains of some      abdominal cramps and has inquired about the use of Bentyl.      However, I have not at this time recommend  any Bentyl use for the      patient and we will not be discharging her on Bentyl.  Dr. Matthias Hughs      from gastroenterology was consulted during her hospitalization and      suggested that the patient should be followed up and evaluated for      inflammatory bowel disease as an outpatient.  The patient will      follow up with Dr. Evette Cristal in the office for further evaluation.      That evaluation per their records will include a flexible      sigmoidoscopy versus colonoscopy.  There was some suggestion that      the patient may have a fistula and a Gastrografin enema was ordered      and done on this patient on August 26, 2008, and did not show any      suggestion of an enema diverticulitis or any other abnormalities.      In terms of her hypokalemia, the patient was mildly hyperkalemic      during her hospitalization.  This was repleted and the patient is      eukalemic at the time of discharge with a potassium of 3.6.  2. Anemia.  The patient has a chronic anemia and her hemoglobin has      been stable at about 9.9 and she has not required any transfusions      while hospitalized.   This essentially brings [place] to the end of the hospital course for this  patient.  Again, please refer to the prior discharge summaries for more  details regarding her hospitalization during those time frames.  I have  tried to provide an overview of her hospital stay up to this point.  In  terms of followup, the patient will follow up with Dr. Zachery Dakins for an  appointment scheduled on September 12, 2008, and with Dr. Evette Cristal for an  appointment scheduled on September 15, 2008.  In terms of management of  her wound, the patient is to place a bandage over the drain site on  August 29, 2008, and to keep it bandaged while healing.  It is okay for  the patient to shower but the patient is not allowed to take any baths  until the site is well closed in approximately 2 weeks.  The patient is  being given care notes for  low-residue diet and knows that she needs to  keep her stools soft with the use of MiraLax as needed.  Physical  restrictions are none.  In terms of her return to work, the patient will  see Dr. Zachery Dakins and at that time he will determine about her release  back to work.   TOTAL TIME FOR THIS DISCHARGE PROCESS:  15 minutes.      Altha Harm, MD  Electronically Signed  MAM/MEDQ  D:  08/28/2008  T:  08/28/2008  Job:  [id number]   cc:   Dois Davenport A. Rivard, M.D.  Anselm Pancoast. Zachery Dakins, M.D.  Bernette Redbird, M.D.  Graylin Shiver, M.D.

## 2010-12-28 NOTE — Discharge Summary (Signed)
[name], [name]                  ACCOUNT NO.:  [id number]   MEDICAL RECORD NO.:  [id number]          PATIENT TYPE:  INP   LOCATION:  4155                         FACILITY:  [facility]   PHYSICIAN:  Beckey Rutter, MD  DATE OF BIRTH:  [birth year]   DATE OF ADMISSION:  08/04/2008  DATE OF DISCHARGE:                               DISCHARGE SUMMARY   Please refer to the previously dictated discharge summation for hospital  course prior to August 21, 2008.  The following issues transpired last  week.  1. The patient had a followup CT abdomen, result is as below.  2. The patient was taken off of tigecycline and she was put on      Augmentin.  As of today, the patient had Flagyl added to the      Augmentin and currently, she is on Flagyl and Augmentin.  3. The patient felt she is doing okay and she wanted to be discharged      to follow up for possible sigmoidoscopy as an outpatient.   PROCEDURES:  1. Imaging.      a.     The patient had CT abdomen without contrast on August 22, 2008, impression was showing increased size of abscess collection       in the lower anterior abdominal cavity.  The margins of this       abscess are poorly characterized due to the adhesion loops of       small bowel.      b.     No significant fluid surrounding the drainage catheter and       the adhesion collection has decreased in size.      c.     Persistent inflammatory changes and bowel wall thickening in       the lower abdomen.  2. The pelvic CT scan done on the same setting is showing:      a.     Mild distention of the distal colon and rectum.      b.     Status post hysterectomy.  3. The patient has limited pelvis CT scan on August 23, 2008;      impression,      a.     There is an irregular fluid collection in the lower mid       abdomen that is concerning for abscess collection.  There is new       high density material within this collection suggesting that there       is an adjacent bowel  leak.  There are loops of small and large       bowel adjacent to this collection.  Findings were discussed with       Dr. Zachery Dakins.  A water soluble enema might be useful to exclude       colonic leak.  A new percutaneous drain was not placed within the       air fluid collection due to overlying small bowel.   DISCHARGE DIAGNOSES:  1. Intra-abdominal abscesses as discussed above.  2. Diverticulitis/diverticulosis.   DISCHARGE MEDICATIONS:  I suspect the patient will be discharged on  Flagyl and Augmentin until the sigmoidoscopy.  Nevertheless, discharge  plan is not fully formulated at this time and we will need to discuss  with GI Services as well as the surgical team the discharge plan, and  the patient had fever 100.1 this afternoon and the  patient was started on Flagyl.  We will continue to monitor this low-  grade fever.  The patient currently is able to tolerate p.o.   The patient will be transitioned to oral pain medication prior to her  discharge.      Beckey Rutter, MD  Electronically Signed     EME/MEDQ  D:  08/26/2008  T:  08/27/2008  Job:  [id number]

## 2010-12-28 NOTE — H&P (Signed)
[name], [name]                  ACCOUNT NO.:  [id number]   MEDICAL RECORD NO.:  [id number]          PATIENT TYPE:  INP   LOCATION:  3714                         FACILITY:  [facility]   PHYSICIAN:  Joylene John, MD       DATE OF BIRTH:  1976-05-23   DATE OF ADMISSION:  03/24/2008  DATE OF DISCHARGE:                              HISTORY & PHYSICAL   CHIEF COMPLAINTS:  Severe onset of abdominal pain starting at 3 o'clock  in the afternoon.   HISTORY OF PRESENT ILLNESS:  This is a 35 year old female who was  recently admitted in the hospital in May 2009 with abdominal pain and  was found to have microperforations, became septic, and required ICU  stay.  Please refer to the discharge summary for more details on that  hospitalization.  The patient comes in with severe onset of abdominal  pain, which started at 3 o'clock in the afternoon while she was at work.  Per the patient since the onset the pain has steadily gotten worse and  is currently 10 out of 10.  According to the patient, this pain is  similar to her presentation when she was in the hospital the last time.  The pain is associated with nausea and vomiting.  The patient has  vomited two times, no blood.  The patient denies any fevers, chills, or  diarrhea.  Pain is most severe in the left lower quadrant and the lower  pelvic region.   PAST MEDICAL HISTORY:  Recent hospitalization for diverticulitis and  sepsis.   FAMILY HISTORY:  Noncontributory.   SOCIAL HISTORY:  Noncontributory.   ALLERGIES:  Include allergy to PENICILLIN.   REVIEW OF SYSTEMS:  Please refer to the HPI.   Vital signs showed temperature of 101.3, blood pressure 101/69, pulse is  105, respiration 18.  The patient is sating at 100% on room air.  On  exam, the patient is in pain and in distress.  He is wincing at the  slightest movements.  The patient is tachycardiac.  Lungs are clear to  auscultation.  Bowel sounds are not heard.  There is generalized  tenderness all over the abdomen with maximal tenderness in the left  lower quadrant and the lower pelvic region with rebound.  No lower  extremity edema appreciated.   Labs show a white count of 12,000 with neutrophil shift of 87,000,  hemoglobin is 12, crit of 36.2, and platelets 400.  Sodium 134,  potassium 3.8, chloride 101, bicarb 25, BUN 6, creatinine 0.67, glucose  110, albumin 3.5, lipase 16.  UA was clear.  CT showed early collection  in the right hemipelvis with extension into the left hemipelvis.   ASSESSMENT AND PLAN:  A 35 year old female coming in with severe  abdominal pain and early collection on CT.  Plan to admit the patient to  telemetry bed.  We will give IV fluids.  We will continue the ertapenem,  which was  given in the ER.  We will do blood cultures x2.  Repeat labs in the  morning.  Pain control with  Dilaudid and nausea control with Zofran.  We  will give Tylenol for fever.  We will keep the patient n.p.o. and we  will get a surgery consult for further evaluation and intervention.      Joylene John, MD  Electronically Signed     RP/MEDQ  D:  03/25/2008  T:  03/25/2008  Job:  [id number]

## 2010-12-28 NOTE — Discharge Summary (Signed)
NAMELINDEN, MIKES                  ACCOUNT NO.:  [id number]   MEDICAL RECORD NO.:  [id number]          PATIENT TYPE:  INP   LOCATION:  5124                         FACILITY:  [facility]   PHYSICIAN:  Anselm Pancoast. 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  2. Single diverticulum with a localized area of diverticulitis in the      sigmoid colon.  3. History of previous intra-abdominal pelvic abscesses secondary to      pelvic inflammatory disease and previous 6 months status post      abdominal hysterectomy, left salpingo-oophorectomy, and right      salpingo-oophorectomy.   OPERATION:  Exploratory laparotomy and resection or repair of entero-  entero fistula of ileum and a limited resection of the sigmoid colon.   HISTORY:  [name] is a 35 year old female who, when I was on the  doctor of the week service back in August 2009, she had been admitted.  She had had previous admissions to [facility] and had seen Dr. Baruch Merl  and whether this was diverticulitis or what was never quite sure.  She  had had 1 admission for what was thought to be PID, and at this time and  she was admitted to the doctor of the week service, actually, I think  she was on [facility], but we were seeing her and she had large fluid  collections in both pelvis.  Her mother had had a sigmoid colectomy at  about age 19 and the patient was basically refusing to consider surgery  because of the fear of possibly having a colostomy.  When I came on and  saw her, she obviously was quite ill at that time with a marked  leukocytosis, fever spikes, and etc.  Had been seen by Infectious  Disease and other services and I thought that it was imperative that we  go ahead and proceed with surgery because of in spite of percutaneous  drainage, etc., she was failing  to improve after a fairly long  hospitalization.  She agreed to a gastrograph and enema that was done  and there was no evidence of any diverticulosis noted and with that, she  finally gave permission to proceed with surgery.  I took her to Surgery  and found that she had large tube ovarian abscesses and had the  gynecologist, who had seen her previously, actually they sent up an  associate, and then I assisted them do an abdominal hysterectomy, left  salpingo-oophorectomy, and removed the right tube.  The left ovary was  left since she is only 32, and she recovered after probably another week  or little more in the hospital and was released.  She did fine.  We had  kept her on the GYN antibiotics for an additional 2 weeks and then  approximately 2 months later, she had another episode of similar pain.  This one, she was much less ill and was  readmitted around the Christmas.  Hospital various physicians saw her and she had a fluid collection in  the left pelvis.  I did not see her, I was on vacation of Christmas  holidays and then I put a percutaneous drainage in after Christmas,  obtained fluid which was turbid, but did not grow any bacteria.  She was  on broad antibiotics and then there was another little fluid collection  always kind of up in the left upper area of the pelvis and I think early  January 2010, we got another Gastrografin and barium enema and it was  identical to the previous one.  There was a little area of hesitancy of  the area opening up, but no evidence of any obvious diverticulitis or  fistula or anything noted.  The radiologist tried to  percutaneous__________, but could not get any catheter in.  They did get  a small amount of fluid, but it failed to grow any organism.  She was  discharged on antibiotics for diverticulitis.  She had seen Dr. Lina Sayre and then I have been following her in the office.  Dr. Evette Cristal also  is following her.  Dr. Matthias Hughs had seen her  during the time of her  admission in December 2009, and in February 2010, she had a colonoscopy  by Dr. Evette Cristal that does not show any evidence of diverticulosis,  diverticulitis, Crohn disease, or any problems and was thought to be a  normal colon on colonoscopy exam.  At this time on November 29, 2008, she  was admitted through the ER with pain, said she had a temperature of a  101.6, but her white count in the ER was 8200 and because of her  previous illnesses and etc., she was admitted.  Her only medication had  been MiraLax and she does have a history of chronic constipation.  She  was admitted by Dr. Freida Busman, she was seen by Dr. Donell Beers the next day.  She  is kind of mildly tender in the left lower quadrant.  A CT was performed  and it showed just little fluid collection around the small bowel in the  left kind of junction of the abdomen and pelvis, not really a  significant thickening at all of the sigmoid colon and this was very  similar to the x-rays, CTs that she had had in December 2009, except  that she did not have all of the fluid in the pelvis.  There was a  question about the right ovary whether it was a normal ovary or not  since it appears to be a little bit more cystic than we would anticipate  and they did an MRI of the pelvis, thought that it was okay.  She had a  vaginal ultrasound.  There was no evidence of any infection in the cuff  and on the ultrasound, they thought the ovary was a normal ovary.  I saw  her on Monday and with the symptoms and her pain and etc., it was  obviously a fluid collection.  I recommended that we proceed on with  surgery and not try to do a percutaneous drainage and she has had  multiple and the fluid that they have removed was always basically not  infected.  On one of the abscesses that had been drained on the right  and this was before her surgery, the abscess cavity communicated with  both tubes and ovaries which the radiologist thought were  definitely  very  unusual as it was a diverticular abscess.  I took her to Surgery on  Wednesday.  She again would not sign the permit if there was any chance  of a colostomy, but she was well aware that if we thought that it was  absolutely necessary that that is what we would plan on doing in spite  of her wishes that she not actually have an ostomy.  We had gotten a  PICC line in her on Monday and she was taken to Surgery.  Dr. Donell Beers  assisted and what we found, there was definitely a fluid collection  around the small bowel on a very localized and there was a little  communication between the ileum, the ileum probably about 4 feet from  the ileocecal valve.  There was no evidence of any Meckel's and we did  culture the fluid which was turbid.  It did not grow bacteria, but it  later grew fungus.  She had been switched, originally came in I think  from Zosyn to Primaxin and Flagyl and I discontinued the antibiotics.  The area of the colon where this little area, where she had always with  a gastrograph and enemas showed a little area of like spasm.  It was  definitely thickened, but it was thickened on the antimesenteric surface  and not that of an obvious typical diverticulitis, but since it was a  little localized area, she had had 2 x-rays that had kind of question  whether there might be something there.  I elected to go ahead and do a  little limited resection with a staple side-to-side GIA TA 60, were  excised and a little specimen and on pathology exam, this is consistent  with diverticulitis with a pericolonic abscess.  There was no evidence  of any fistula and etc., and this did not communicate with the little  area around the ileum to ileum.  I also repaired the little fistula from  the small bowel with 2 little not complete resections, but sort of  excised in the area of question and then closing the small bowel with 2-  layered closure of sutures.  Postoperatively, she has  done nicely.  We  kept her on antibiotics.  She has never had a significant elevated  temperature where her white count has been normal and the antibiotics  were stopped about 36 hours ago.  She is doing nicely, having bowel  movements.  Her diet has been advanced.  She is gradually increasing her  activity and I think can be discharge.  We had her on Lovenox right  after her surgery and she had a little drainage from the incision.  Whether this was Lovenox induced or just a little seroma in the wound, I  am not sure, though we discontinued the Lovenox for the last several  days.  She is ready for discharge now in improved condition.  Will see  me in the office in 1 week or probably return to work in approximately 3  weeks.  Hopefully, she will not have further problems with this  infection.  We would recommend that she continue with the MiraLax since  she has a tendency for constipation.  She has Vicodin for incisional  pain and was instructed if she has what she thinks as a fever, to  measure temperature.  If she has a temperature of 101 or so, she is  definitely to call our office since she is not on antibiotics.  Anselm Pancoast. Zachery Dakins, M.D.  Electronically Signed     WJW/MEDQ  D:  12/11/2008  T:  12/11/2008  Job:  [id number]   cc:   Graylin Shiver, M.D.

## 2010-12-28 NOTE — Op Note (Signed)
NAMECYDNI, REDDOCH                  ACCOUNT NO.:  [id number]   MEDICAL RECORD NO.:  [id number]          PATIENT TYPE:  INP   LOCATION:  2622                         FACILITY:  [facility]   PHYSICIAN:  Crist Fat. Rivard, M.D. DATE OF BIRTH:  September 10, 1975   DATE OF PROCEDURE:  03/26/2008  DATE OF DISCHARGE:                               OPERATIVE REPORT   PREOPERATIVE DIAGNOSIS:  Pelvic abscesses of unclear etiology.   POSTOPERATIVE DIAGNOSIS:  Bilateral tubo-ovarian abscesses.   ANESTHESIA:  General.   PROCEDURE:  Exploratory laparotomy and appendectomy by Dr. Zachery Dakins,  total abdominal hysterectomy with left salpingo-oophorectomy and right  salpingectomy by Dr. Estanislado Pandy, with postoperative cystoscopy by Dr.  Estanislado Pandy.   ESTIMATED BLOOD LOSS:  350 mL.   PROCEDURE:  This is a patient I was consulted on and requested an  intraoperative consultation after Dr. Zachery Dakins had prepped the patient  and performed an exploratory laparotomy.  Upon my arrival, the patient  is in dorsal decubitus position, under general anesthesia, intubated  with knee high sequential compressive devices, and has a midline  incision from umbilicus to the suprapubic area.  Dr. Zachery Dakins informs  me he is just now completing an appendectomy and he has removed a pelvic  abscess wall from the right lower quadrant.   Exploration reveals a greatly edematous anterior fold of bladder and the  anterior peritoneum.  The uterus appears edematous and in inflammatory  process.  Both tubes are largely dilated and very edematous.  The right  ovary is slightly to the left pelvic wall and is small.  The left ovary  is very difficult to recognize.  The posterior cul-de-sac is initially  obliterated with edematous rectal mucosa and adhesions and the anterior  cul-de-sac is almost impossible to identify.  After exploring a little  further, we are able to identify the right round ligament which is  grasped with 2 Kelly forceps, sectioned,  and sutured with 0 Vicryl.  Decision is made at this time to proceed with either partial or total  hysterectomy and with removal of both tubes and hopefully, we will be  able to salvage one ovary to avoid surgical menopause.   This is a patient who was consented verbally over the phone by Dr.  Osborn Coho for possible TAH/BSO.     The right tube is then grasped and the mesosalpinx is isolated with  Kelly forceps.  The tube is removed and sent.  The right ovary remains  in place.  The right cornua of the uterus is then grasped, and we can  elevate it and we now performed a blunt dissection of the bladder away  from the uterus.  The broad ligament is non identifiable, but we are  able to work this bladder down to below the cervix in a blunt and sharp  dissection.  Bleeders are encountered and cauterized.  I am unable at  this time to clearly identify the right ureter, but we will not proceed  with a right oophorectomy and the IP ligament will remain intact.  Uterine vessels on the right are skeletonized  and clamped with Heaney  clamps, sectioned, and sutured with a transfix suture of 0 Vicryl.   On the left side, the round ligament is identified, isolated with Kelly  forceps, sectioned, and sutured with a transfix suture of 0 Vicryl.  The  IP ligament is isolated after opening the retroperitoneum and is away  from the ureter.  This IP ligament is identified, clamped twice with  Heaney clamps, and sectioned.  It is sutured with a transfix suture of 0  Vicryl.  At this time, the tube and the ovary just come off the uterus  by simple traction.  The uterine vessels on the left side are then  identified and isolated with Heaney clamp, sectioned, and sutured with a  transfix suture of 0 Vicryl.  We now concentrate on the posterior cul-de-  sac and move the rectum away from it by blunt dissection.  The posterior  cul-de-sac is surprisingly free of any disease.  We can now clarify the   position of the cervix, as we worked our way down.  With straight Heaney  forceps, we isolate the cardinal ligament on each side, section it, and  suture with a transfix suture of 0 Vicryl.  Uterosacral ligaments with  vaginal fornix angle are isolated with curved Heaney, clamped,  sectioned, and sutured with transfix suture of 0 Vicryl.  We can now  proceed with a complete colpotomy and remove the uterus entirely with  the cervix.  The vagina is then closed with figure-of-eight stitches of  0 Vicryl at each angle sutured to the corresponding uterosacral  ligament.  Hemostasis is completed on the vaginal vault with an extra  figure-of-eight stitch of 0 Vicryl.  We then irrigate profusely with  warm saline and note bleeding on the mesosalpinx on the right which is  grasped and sutured with a transfix suture of 2-0 Vicryl.  Again, we  irrigate profusely with warm saline.  We now note satisfactory  hemostasis.  It is unfortunately impossible to clearly identify ureters  and decision is made to proceed with a postoperative cystoscopy.   At this time, Dr. Zachery Dakins resected more of the parietal peritoneum on  the right side that is thickened and inflammatory.  This was also sent  to pathology.  Dr. Zachery Dakins also evaluated the bowel completely and  does not note any diverticuli and so the origin of this is now confirmed  to be gynecological.   Dr. Zachery Dakins proceeded with a total wall closure using looped PDS after  placing a drain in the pelvis and suturing this drain with a 0 silk and  the skin is closed with staples after irrigating the wound with warm  saline.   After the wound is dressed, the patient is repositioned in lithotomy  position, and she has a vaginal prep with Betadine.  A 70-degree  cystoscope was introduced.  After giving IV indigo carmine, it is  rapidly noted to have bilateral ureteral jet confirming ureteral  integrity.  Cystoscope was removed, and a Foley catheter was  placed for  the postoperative course.   Instrument and sponge count is complete x2.  Estimated blood loss is 350  mL.  The procedure is well tolerated by the patient who is taken to  recovery room in a well and stable condition.   SPECIMEN:  GYN specimen is uterus, cervix, left tube, a left ovary, and  right tube sent to pathology.      Crist Fat Rivard, M.D.  Electronically Signed  SAR/MEDQ  D:  03/26/2008  T:  03/27/2008  Job:  [id number]

## 2010-12-28 NOTE — H&P (Signed)
NAMEMEDRITH, VEILLON                  ACCOUNT NO.:  [id number]   MEDICAL RECORD NO.:  [id number]          PATIENT TYPE:  INP   LOCATION:  5151                         FACILITY:  [facility]   PHYSICIAN:  Lennie Muckle, MD      DATE OF BIRTH:  29-Jun-1976   DATE OF ADMISSION:  11/29/2008  DATE OF DISCHARGE:                              HISTORY & PHYSICAL   Ms. Enge is a 35 year old female, who is a patient of Dr. Petra Kuba, who had had recent hospitalizations for abdominal pain.  She does have a history of diverticular disease and had an exploratory  laparotomy with oophorectomy and abscess drainage in August 2009.  She  has had 2 other admissions for left-sided abdominal pain.  She had a  colonoscopy in the beginning of the year, which was normal.  Apparently,  she began having pain on Wednesday.  The pain worsened over the next  couple of days and had an associated nausea and vomiting.  The pain is  worse with movement.  She is suffering from constipation.  Due to her  history of diverticulitis, she had a CT scan in the emergency  department, which showed inflammatory changes and small air locules  around the sigmoid colon.  There was no abscess presently, but the small  area of inflammation was in the area of the previous abscess.  She did  have fevers at home and had a fever in the emergency department of  101.6.  She states she is feeling somewhat better with the narcotics.  The pain is intermittent and crescendo-decrescendo.   PAST MEDICAL HISTORY:  Significant for diverticulitis.   FAMILY HISTORY:  Hypertension.   SOCIAL HISTORY:  She has 2 children at home.  No alcohol or tobacco use.   ALLERGIES:  PENICILLIN.   MEDICATIONS:  None at home other than MiraLax.   REVIEW OF SYMPTOMS:  As per the HPI.   PHYSICAL EXAMINATION:  GENERAL:  She is a pleasant, young female, lying  on the bed, in no acute distress.  VITAL SIGNS:  Current temperature 98.6, pulse 96, and blood  pressure  105/69.  HEENT:  Sclerae are clear.  Oral mucosa is moist.  CHEST:  Clear to auscultation bilaterally.  CARDIOVASCULAR:  Regular rate and rhythm.  ABDOMEN:  Midline lower incisional scar, nontender.  Some tenderness to  palpation in the left lower quadrant, but no peritonitis.  MUSCULOSKELETAL:  No deformities or edema seen.  SKIN:  Again, no rashes.   CT as per the HPI.   Hemoglobin is 12.9 and white count is 8.2.   IMPRESSION AND PLAN:  Diverticulitis with inflammatory changes around  the sigmoid colon.  I have placed her on Cipro and Flagyl.  Keep her  n.p.o. for the time being.  I will discuss her case with Dr. Zachery Dakins.  There is a question of whether or not she would benefit from a sigmoid  resection.  There have been no findings on her colonoscopy of  diverticulitis, but she has had recurrent abscesses and infections.  I  have discussed this  with the patient and her mother.  They are  questioning whether or not she would benefit from a specialty center for  gastrointestinal problems.  But I will defer this to Dr. Zachery Dakins, to  talk with the patient and decide this together.  She will be given  Protonix for GI prophylaxis, SCDs, and heparin for DVT prophylaxis.      Lennie Muckle, MD  Electronically Signed     ALA/MEDQ  D:  11/29/2008  T:  11/30/2008  Job:  [id number]

## 2010-12-28 NOTE — H&P (Signed)
NAMEDESAREA, OHAGAN                  ACCOUNT NO.:  [id number]   MEDICAL RECORD NO.:  [id number]          PATIENT TYPE:  INP   LOCATION:  3703                         FACILITY:  [facility]   PHYSICIAN:  Lonia Blood, M.D.      DATE OF BIRTH:  1976/06/08   DATE OF ADMISSION:  08/04/2008  DATE OF DISCHARGE:                              HISTORY & PHYSICAL   PRIMARY CARE PHYSICIAN:  The patient is unassigned to [place].   PRESENTING COMPLAINT:  Abdominal pain.   HISTORY OF PRESENT ILLNESS:  The patient is a 35 year old female with  multiple hospitalizations with a diagnosis of diverticulitis and pelvic  abscess.  The patient was last admitted in August of this year at which  point she had intrapelvic abscess that resulted in multiple surgeries by  Dr. Zachery Dakins as well as OB/GYN.  She seemed to have improved and went  home.  Yesterday, however, the patient started having abdominal pain  mainly in the right lower quadrant.  It was constant and is getting  worse.  She has some associated nausea but no vomiting.  The pain was  10/10.  No fever, no chills.  No hematochezia, no melena.  Due to the  patient's prior history of multiple problems she was brought in for  further management.   Her past medical history significant for previous diverticulitis.  Pelvic abscess in August, and she was status post cholecystectomy,  hysterectomy and oophorectomy.  She had exploratory laparotomy at the  time with lysis of adhesions, drainage of multiple abscesses in the  pelvic.  She had appendectomy and then an abdominal hysterectomy with  left salpingo-oophorectomy.  She also had removal of the right fallopian  tube.  The patient had both gynecologist and general surgery involved in  her surgery at the time.  At that time, the primary diagnosis on the CT  scan was question of diverticulitis.  The patient again today was seen  in the ED complaining of similar symptoms, although she has had surgical  removal.   ALLERGIES:  THE PATIENT IS ALLERGIC TO PENICILLIN, BUT SHE TOLERATED  UNASYN THE LAST TIME.   SOCIAL HISTORY:  The patient is single.  Denied alcohol, tobacco, or IV  drug use.   FAMILY HISTORY:  The patient denied any significant family history,  except for hypertension.   REVIEW OF SYSTEMS:  Twelve-review of systems is negative except per HPI.   PHYSICAL EXAMINATION:  Today, temperature is 98.7, blood pressure  124/79, pulse 119, respiratory rate 18, saturations 100% on room air.  GENERAL:  The patient is in obvious pain, young lady, acutely ill.  HEENT:  PERRL.  EOMI.  NECK:  Is supple.  No JVD, no lymphadenopathy.  RESPIRATORY:  Good air entry bilaterally.  CARDIOVASCULAR SYSTEM:  She is tachycardiac.  ABDOMEN:  Is full, soft with tenderness in the right lower quadrant more  than left lower quadrant, mild rebound tenderness but no guarding.  EXTREMITIES:  No edema, cyanosis or clubbing.   LABORATORY DATA:  White count 9.6, hemoglobin 11.5, platelet count 254  with a left  shift.  ANC of 8.8.  Sodium is 137, potassium 3.2, chloride  105.  CO2 25, glucose 111, BUN 7, creatinine 0.71, and calcium 8.1.  Albumin is 3.  Lipase is 16.  Urinalysis is negative.   CT scan of the abdomen and pelvis showed perihepatic ascites, abdomen  otherwise negative.  There was bibasilar atelectasis change worse on the  right; developing pneumonia in the right lung base is not excluded.  Two-  view abdominal x-ray showed no intra-abdominal process.  CT pelvis  showed no pelvic abscess this time around.   ASSESSMENT:  Therefore, this a 35 year old female with history of both  diverticulitis and pelvic abscess in the past presenting with right-  sided abdominal pain.  CT pelvis has question of possible sigmoid  diverticulitis with pelvic ascites and extensive infiltration of fat in  the pelvis but no abscess.  The patient, however, had similar readings  in past, mainly of diverticulitis, but later  found to have intra-  abdominal abscess.  Other findings are those of hypokalemia, protein-  calorie malnutrition.  Also tachycardia, probably reactive.   PLAN:  1. Abdominal pain, possibly acute diverticulitis.  Will admit the      patient and treat her with IV antibiotics.  In this case, will use      Cipro and Flagyl.  At the same time, we will follow up the      patient's progress and consider surgical consult, again Dr.      Zachery Dakins.  2. Possible pneumonia, based on the patient's CT, possibly early      pneumonia.  If that is the case, will either switch the patient to      Unasyn or will add Rocephin.  She is allergic to penicillin but      tolerated Unasyn the last time.  We can safely add or we can change      her ciprofloxacin to Levaquin which can cover pneumonia.  3. Hypokalemia.  Will replete her potassium.  4. Tachycardia.  This is most likely reactive.  Will treat her      symptomatically while observing her on telemetry bed.   Otherwise, we will follow the patient's response with pain control and  conservative measures at this point.      Lonia Blood, M.D.  Electronically Signed     LG/MEDQ  D:  08/05/2008  T:  08/05/2008  Job:  [id number]

## 2010-12-28 NOTE — H&P (Signed)
NAMESHERISSE, FULLILOVE                  ACCOUNT NO.:  [id number]   MEDICAL RECORD NO.:  [id number]          PATIENT TYPE:  INP   LOCATION:  5151                         FACILITY:  [facility]   PHYSICIAN:  Della Goo, M.D. DATE OF BIRTH:  1976-06-06   DATE OF ADMISSION:  10/29/2007  DATE OF DISCHARGE:                              HISTORY & PHYSICAL   CHIEF COMPLAINT:  Abdominal pain.   HISTORY OF PRESENT ILLNESS:  This is a 35 year old female who presents  to the emergency department with complaints of severe left lower  quadrant abdominal pain which has been occurring for the past 2 days.  She rates the pain as being a 7/10 at the worst.  She reports having  fevers and chills.  Also reports having nausea, vomiting, and diarrhea.  She denies having any hematemesis, hematochezia, or melena.   PAST MEDICAL HISTORY:  None.   PAST SURGICAL HISTORY:  History of a cholecystectomy in the past.   MEDICATIONS:  None.   ALLERGIES:  PENICILLIN.   SOCIAL HISTORY:  Nonsmoker, nondrinker.   FAMILY HISTORY:  Positive hypertension in her father, positive coronary  artery disease in her father.  Positive diabetes mellitus in her mother.  History of breast cancer in her maternal aunt.   REVIEW OF SYSTEMS:  Pertinents mentioned above.   PHYSICAL EXAMINATION FINDINGS:  This is an 35 year old, well-developed,  well-nourished female in discomfort but no acute distress.  Her vital  signs are temperature 102.1, blood pressure 129/79, heart rate 124,  respirations 20, O2 saturation 99% on room air.  HEENT:  Normocephalic, atraumatic.  There is no scleral icterus.  Pupils  are equally round, reactive to light.  Extraocular muscles are intact.  Funduscopic benign.  Oropharynx is clear.  NECK:  Is supple.  Full range of motion.  No thyromegaly, adenopathy,  jugular venous distention.  CARDIOVASCULAR:  Regular rate and rhythm.  Tachycardiac rate.  No  murmurs, gallops, or rubs.  LUNGS:  Clear to  auscultation bilaterally.  ABDOMEN:  Positive bowel sounds, soft, tenderness in the left lower  quadrant.  No rebound or guarding.  No hepatosplenomegaly.  EXTREMITIES:  Without cyanosis, clubbing, or edema.  NEUROLOGIC EXAMINATION:  Nonfocal.   LABORATORY STUDIES:  White blood cell count 11.4, hemoglobin 12.7,  hematocrit 37.4, platelets 299, neutrophils 80%, MCV 95.3.  Chemistry  with a sodium of 138, potassium 3.5, chloride 105, bicarb 23, BUN 7,  creatinine 0.9, and glucose 88.  Wet prep performed by EDP, results of  which were negative.  GC and chlamydia studies performed also negative  and RPR nonreactive.  The CT scan of the abdomen and pelvis reveal  positive sigmoid colon diverticulitis.  Also an enlarged lymph node 2.2  x 1.3 cm, prominent bilateral inguinal lymph nodes also present   ASSESSMENT:  A 35 year old female being admitted with:  1. Abdominal pain.  2. Diverticulitis.  3. Nausea, vomiting, diarrhea.  4. Febrile illness.  5. Inguinal lymphadenopathy.   PLAN:  The patient will be admitted and placed on IV antibiotic therapy  of ciprofloxacin and IV metronidazole therapy.  The patient will also be  placed on IV fluids for rehydration therapy, and antiemetic and pain  control therapy will be ordered.  The patient will be monitored for  further changes, and a further workup will ensue pending her clinical  course.  DVT and GI prophylaxis have also been ordered.      Della Goo, M.D.  Electronically Signed     HJ/MEDQ  D:  10/30/2007  T:  10/30/2007  Job:  [id number]

## 2010-12-28 NOTE — Discharge Summary (Signed)
[name], [name]                  ACCOUNT NO.:  [id number]   MEDICAL RECORD NO.:  [id number]          PATIENT TYPE:  INP   LOCATION:  3704                         FACILITY:  [facility]   PHYSICIAN:  Charlestine Massed, MDDATE OF BIRTH:  January 30, 1976   DATE OF ADMISSION:  08/04/2008  DATE OF DISCHARGE:                               DISCHARGE SUMMARY   INTERIM DISCHARGE SUMMARY   PRIMARY CARE PHYSICIAN:  Lonia Blood, M.D.   GASTROENTEROLOGY:  Graylin Shiver, M.D.   SURGERY:  [facility] team, Dr. Ezzard Standing and Dr. Zachery Dakins.   OB/GYN:  Crist Fat. Rivard, M.D.   INFECTIOUS DISEASE:  Acey Lav, M.D., Cliffton Asters, M.D.   REASON FOR ADMISSION:  Abdominal pain.   HOSPITAL COURSE:  [name] is a 35 year old female who has had  multiple hospitalizations for pelvic abscesses since March, 2009.  She  was admitted previously, and she had an exploratory laparotomy at that  time, after which she again had pain, so she returned.  This time again  CT abdomen and pelvis done in the emergency room initially showed  evidence of diverticulitis.  Admitted with IV antibiotics.  After 3  days, patient had a repeat CAT scan which was done on the 27th, showed  evidence of an abscess, extraluminal fluid collection in the right  pelvis 4 x 6 cm, suggestive of an abscess.  Patient was seen by  interventional radiology, and CT-guided drainage of the abscess was  done.  A drain was placed.   The drain was placed by interventional radiology.  The fluid from the  abscess was sent for studies, which revealed there were no bacteria,  follow-up WBCs, but there are no bacteria growing.  Culture and Gram's  stain are negative for infection.  Patient was continued on Zosyn, and  then antibiotic was changed by surgery to imipenem.  Patient was  continued on TNA via PEG line for nutrition, and was kept n.p.o.  Patient was slowly started on clear liquids and advanced to low residue  diet as of  yesterday.  The patient has been tolerating the food well.   Patient had a drop in the WBC count, despite fevers, so the top-end WBC  count was possibly due to a penicillin-induced immunosuppression, and  she was discussed with infectious disease.  They agreed that changing  the antibiotics to tigecycline.  Patient was discontinued off the  imipenem yesterday and started on tigecycline.   Today, the patient had a CAT scan of the abdomen and pelvis with IV  contrast overnight and showed a decrease in the fluid collection, which  has the drain.  The other fluid collections have also decreased in size.  Patient has been afebrile for more than 36 hours.  Patient was seen  today by surgery and gastroenterology.  Surgery suggested that once the  drain is removed, the patient can be discharged.  Gastroenterology also  saw the patient and suggested that the patient needs to be evaluated as  outpatient for colonoscopy for possible inflammatory bowel disease.  A  date has been given  as a follow-up appointment to the patient by  gastroenterology.  The etiology of this abscess is currently speculated  to be inflammatory bowel disease, so patient needs colonoscopy for that.   Currently, patient still has a drain in.  Interventional radiology  evaluated the patient today.  Patient will have the drain in for another  2 days possibly.  Once her drain is removed and she is advanced to  PO  feeds,  she can be discharged.   Today is day #2 of tigecycline.  Patient has already completed a long  course of Zosyn.  Patient currently has a PICC line in the right upper  extremity.   Discussed with infectious diseases, and pt can go home on Augmentin 875  bid for 2 weeks.   Also, patient is on TNA, and the patient's diet is being slowly  advanced.  Once the patient is able to tolerate diet fully, patient can  be discontinued on the TNA before discharge.   CURRENT MEDICATIONS:  1. Protonix 40 mg p.o.  daily.  2. Tigecycline 15 mg IV q.12h., day #2.  3. Insulin, NovoLog coverage, for blood sugars.  4. Nutrition supplement via intravenous.   ALLERGIES:  Patient was previously mentioned as allergic to PENICILLIN  and could not tolerate UNASYN.  During this admission, patient has  tolerated Zosyn very well, as well as imipenem very well without any  allergic reactions.   DISPOSITION:  Once her drain is removed, patient will be discharged to  follow up with surgery as outpatient.  Follow up with her primary care  doctor as outpatient and follow up with gastroenterology as outpatient  for further management and further investigations.  Exact medications  will be dictated at the time of discharge.      Charlestine Massed, MD  Electronically Signed     UT/MEDQ  D:  08/18/2008  T:  08/18/2008  Job:  [id number]   cc:   Graylin Shiver, M.D.  Sandria Bales. Ezzard Standing, M.D.  Crist Fat Rivard, M.D.  Fransisco Hertz, M.D.  Lonia Blood, M.D.

## 2010-12-28 NOTE — Discharge Summary (Signed)
Linda, Armstrong                  ACCOUNT NO.:  [id number]   MEDICAL RECORD NO.:  [id number]          PATIENT TYPE:  INP   LOCATION:  5157                         FACILITY:  [facility]   PHYSICIAN:  Lonia Blood, M.D.DATE OF BIRTH:  08/18/1975   DATE OF ADMISSION:  10/29/2007  DATE OF DISCHARGE:  11/03/2007                               DISCHARGE SUMMARY   PRIMARY CARE PHYSICIAN:  __________ .   DISCHARGE DIAGNOSIS:  1. Acute sigmoid diverticulitis - responding to conservative      treatment/antibiotic therapy.  2. Allergy to PENICILLIN.   DISCHARGE MEDICATIONS:  1. Ciprofloxacin 500 mg b.i.d.  for 10 days.  2. Flagyl 500 mg p.o. t.i.d. for 10 days.   FOLLOWUP:  The patient states that she will establish herself with Dr.  Darnell Level for routine following of her known diverticulosis.  Her  mother is well established with him.  She is advised to follow up with  him on an as-needed basis or as soon as possible should she develop  recurrent symptoms after her discharge.   PROCEDURES:  1. CT scan of the abdomen and pelvis on October 29, 2007 - acute      inflammatory change involving the sigmoid colon consistent with      acute diverticulitis.  No evidence for perforation or abscess.      Enlarged pelvic and inguinal lymph nodes were unlikely reactive in      etiology.  2. CT scan of the abdomen and pelvis in followup on November 01, 2007 -      mild improvement in sigmoid diverticulitis.  No evidence of      abscess.  Stable mild inguinal lymphadenopathy.   HOSPITAL COURSE:  [name] is a very pleasant 35 year old female  with no significant past medical history.  She presented to the hospital  on October 29, 2007, with complaints of severe left lower quadrant  abdominal pain for 48 hours' duration.  She also reported fevers,  chills, nausea, vomiting, and some intermittent diarrhea.  CT scan of  the abdomen and pelvis was carried out, which revealed an acute sigmoid  diverticulitis without abscess.  The patient did not have an acute  abdomen.  The patient was kept on p.o.  She was admitted to the ACUTE  unit.  She was administered IV fluids as well as pain medications and  intravenous antibiotics.  She improved very nicely clinically.  Followup  CT scan was accomplished to ensure that the patient had not developed an  occult abscess.  There was no evidence of such.  CT scan did raise a  question of possible C. diff colitis.  C. diff toxin x2 was  obtained and was negative.  Clinically, the patient's findings were not  consistent with C. diff colitis.  On November 03, 2007, the patient was  tolerating a regular diet without difficulty.  She was discharged to  home, will complete a full course of Cipro and Flagyl p.o.  Followup is  at her request, as noted above.      Lonia Blood, M.D.  Electronically Signed     JTM/MEDQ  D:  11/03/2007  T:  11/04/2007  Job:  [id number]   cc:   Velora Heckler, MD

## 2010-12-28 NOTE — Op Note (Signed)
[name], [name]                  ACCOUNT NO.:  [id number]   MEDICAL RECORD NO.:  [id number]          PATIENT TYPE:  INP   LOCATION:  2622                         FACILITY:  [facility]   PHYSICIAN:  Anselm Pancoast. 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   OPERATION:  Exploratory laparotomy, lysis of adhesion, drainage of  multiple abscesses, appendectomy, and then an abdominal hysterectomy,  left salpingo-oophorectomy, and removal of right fallopian tube.   ANESTHESIA:  General anesthesia.   SURGEON:  Anselm Pancoast. Zachery Dakins, MD on the general surgery portion and  Dr. Bernestine Amass.   HISTORY:  [name] is a 35 year old black female, mother of twins 22  months ago C-section delivery, who in March presented to [facility] with pelvic pain, had a thickening on a CT, was treated with  broad antibiotics.  The etiologies were not definitely determined.  She  was noted to have some cyst within her ovaries and then approximately a  month later after oral antibiotics, she presented again with pain and  was nearly septic.  This time, a repeat CT question whether this was  diverticular in origin and history is that her mother had a perforated  diverticulum and colostomy when she was 33 years old.  The patient was  transferred to [facility] when she was infected under [facility]  care, was treated with percutaneous drainage to pelvic fluid collection  draining, we never cultured any organisms.  She improved, then had a  fistulous tract injection, and it was question on the fistulous tract  injection whether there was communication with the left and right  fallopian tubes.   The patient improved, Dr. Colin Benton recommended that she  be seen in the office and they discussed the possible sigmoid colectomy,  hopefully it will be done open on an one-step procedure.  The patient  was then seen back in the office several times by both Dr. Colin Benton and  also Dr. Lurene Shadow having some pain and antibiotics, I think two additional  rounds of antibiotics were given, and the last contact in our office on  February 07, 2008, when she was seen by Dr. Lurene Shadow and was started on  Avelox.  The the patient got a second opinion by Dr. Evette Cristal, who actually  had seen her when she was in the hospital and then also afterwards and  he placed her on Prilosec and the proceeding on was never followed up.  She presented to the emergency room here on Monday night late and was  admitted by Dr. Abbey Chatters.  After CTs, had been started on antibiotics,  and a CT now showed multiple fluid collections in the pelvis and it was  thought possibly to be again a diverticular abscess.  The patient's  clinically white count was about 13,500.  She was definitely tender in  the lower abdomen on the left and right.  This was originally started in  the left lower quadrant, and I talked with Dr. Fredia Sorrow on Tuesday  morning about percutaneous drainage.  He said the larger abscess which  was to the right of midline could be percutaneous drain, but there was  obviously other collections deep in the pelvis that would not be  amenable to any type of drainage.  On reviewing all of the CTs and etc.,  clearly never we had definitely seen diverticulum or definitely a  perforation of the colon, and the patient was very adamant about she was  not going to have any type of surgery with the possibility of having a  colostomy even that her mother had a colostomy.  I contacted Dr. Colin Benton,  who stated she had never said that she would do a sigmoid colectomy, and  now the patient had not ever had a colon examination by barium enema or   colonoscopy.  The patient wanted to not proceed on doing anything  further, yesterday I did get up with Dr. Su Hilt who had on-call for  [facility] GYN and she saw her and was thinking that unlikely to  be GYN in origin.  I recommended that we do a Gastrografin enema in the  morning.  If we see that if there is definitely a communication with the  colon, then she is going to require most likely emergency sigmoid colon  resection in spite of the fact that she was still adamantly against  this.  She was talked yesterday by Dr. Su Hilt that if this was GYN in  origin and she required a hysterectomy, she would agree to that if she  has two children, she is not married.  Her mother and father were with  her while this discussion was being made yesterday with Dr. Su Hilt.  This morning, she is more tender.  She has had fever.  The antibiotics  that she originally was on broad antibiotics and then vancomycin added,  and then I got Dr. Orvan Falconer to see her and she had been switched to  Primaxin this morning, clinically the patient was not improving.  Her  white count was 16,000 and she had fever last night.  She has also had  mild tachycardia.  This morning, the Gastrografin enema was performed,  and there was really no evidence of diverticulosis, diverticulitis,  narrowing of the colon, and it appears that this has not been a colon in  origin problem.  At no time have we never been able to see the appendix  on the multiple CAT scans that she has had, and abscess cavity  injections, and etc.  I with this finding contacted Dr. Su Hilt, who  said she would get over to see the patient.  She thought that it was  very unlikely to be GYN in origin, did not want to be the primary  surgeon, but obviously the patient needs some surgery.  I had Dr.  Orvan Falconer see her for infectious disease.  He was in agreement with  present antibiotics, that no additional tests were needed and was in  agreement that  she needed a laparotomy.   The patient was taken to the operating room.  She had no additional  antibiotics.  She is on both Primaxin and vancomycin per protocols and  positioned on the OR table.  She already has a Foley catheter.  PAS  stockings, and then the abdomen was prepped with Betadine  solution and  draped in sterile manner.  I made a small midline incision and upon  opening to the peritoneal cavity that there was some free fluid, but  there was a fairly large abscess and it was located right above the  uterus predominantly to the right side that I broke into and very  carefully separated the loops of small bowel from this abscess.  There  was an additional abscess going down around the right tube and there was  also one over to the left tube, and the appendix was identified.  There  was marked periappendiceal inflammation, but noticed and it looked like  appendicitis.  We went ahead and did an incidental appendectomy, sent it  down to Pathology, looked at it, and said grossly they feel that this is  not the appendix in origin.  The left tube was markedly swollen and  infected.  The right was also significantly swollen, but not as large as  the left tube and ovary.  At this time, I did call the GYN on-call from  the [facility], and they arrived shortly afterwards.  They were in  agreement that this was GYN in origin, and I assisted her in doing an  abdominal hysterectomy, left tube and ovary, and right tube removal.  The estimated blood loss was approximately 250 mL and after completion  of the procedure, a cystoscopy was performed.  We could see the blue dye  from both orifices of the trigone of the bladder.  The incision after  thoroughly irrigated and aspirated, and a collecting tube was placed in  the pelvis and the abdominal wall with the omentum dropped down over the  small bowel.  I reinspected the small bowel where these marked adhesions  and where this chronic  inflammation had been and could not see any  evidence of any injury or primary process of the sigmoid colon,  transverse colon, small bowel, or cecum.  The omentum was brought down  over the small bowel, the drainage in place, entered into the right  lower quadrant and then we closed the fascia and peritoneum with looped  #1 PDS, and the skin was loosely closed with staples.  The patient  tolerated the procedure nicely.  Sponge and needle counts were correct.  We will keep her on broad antibiotics, and the specimens were sent for  Pathology.  We also sent cultures and Gram stains that were now seen  Gram-positive organism, Gram-negative organism, Gram-positive cocci  consistent with multiple origins.  These cultures are cooking at this  time.           ______________________________  Anselm Pancoast. Zachery Dakins, M.D.     WJW/MEDQ  D:  03/26/2008  T:  03/27/2008  Job:  [id number]

## 2010-12-31 NOTE — H&P (Signed)
NAMEBRAYLEI, [name]                  ACCOUNT NO.:  [id number]   MEDICAL RECORD NO.:  [id number]          PATIENT TYPE:  INP   LOCATION:  9107                          FACILITY:  [facility]   PHYSICIAN:  Osborn Coho, M.D.   DATE OF BIRTH:  02-28-1976   DATE OF ADMISSION:  05/29/2006  DATE OF DISCHARGE:                                HISTORY & PHYSICAL   HISTORY OF PRESENT ILLNESS:  Ms. [name] is a 35 year old gravida 1, para 0,  who was admitted at 38-2/[redacted] weeks gestation for induction of labor secondary  to twin gestation. The patient is having occasional uterine contractions,  however no active labor. The patient denies leakage of fluid or bleeding.  The patient reports the fetuses have been moving normally. The patient has  been normotensive throughout pregnancy. Patient denies PIH signs and  symptoms. The patient's pregnancy has been remarkable for:  1. Twins.  2. History of migraine headaches.   PRENATAL LABS:  Initial hemoglobin 11.7, hematocrit 33.1, platelets 282,000.  Blood type A positive, antibody screen negative. RPR nonreactive. Rubella  immune. Hepatitis B surface antigen negative. HIV was declined.  Pap in  September, 2006 within normal limits. Gonorrhea/Chlamydia cultures negative.  Cystic fibrosis screening negative. Hemoglobin electrophoresis within normal  limits. Quadruple screen was declined. Hemoglobin at 26 weeks 10.4, Glucola  at 26 weeks was 105. TSH at 28 weeks 1.35. Group B strep negative.   HISTORY OF PRESENT PREGNANCY:  The patient entered care at [redacted] weeks  gestation. Patient's last menstrual period was September 03, 2005 with [facility] of  June 11, 2006. EDC confirmed by ultrasound at [redacted] weeks gestation.  Patient's pregnancy has been followed by the M.D. service at [facility]. Ultrasound was obtained as well, at the time of her new OB  visit which revealed twin IUP, consistent with dates with fetal heart tones  present. Two amnions and a twin peak sign  was noted at that time. Anterior  placenta x2 noted.  The patient was also treated for bacterial vaginosis at  the time of her new OB visit as well. The patient with complaints of colicky  abdominal pain and leveling gestation consistent with gastroenteritis which  resolved spontaneously. The patient called with complaints of back pain at  [redacted] weeks gestation which was treated with Tylenol. Patient with complaints  of nausea and vomiting at 13 weeks. Patient refused to use Phenergan at that  time therefore was treated with Zofran with relief of nausea and vomiting.  At 18 weeks patient underwent ultrasound for anatomy which revealed normal  anatomy of both twins, vertex, vertex presentation, cervix 3.0 cm. At that  time patient requested shorter work hours and it was discussed with her no  medical indication at the present time. Patient with complaints of pain with  walking at 19 weeks as well as pelvic pressure. Cervix long and closed at  that time. Patient was advised to use Motrin at that time. At 21 weeks  patient's work hours were decreased to 6 hours per day due to her complaints  of pelvic pressure  and pelvic pain. At 22 weeks patient continued with  complaints of pelvic pressure, however her cervix remained long and closed.  Ultrasound repeated for growth at 24 weeks with concordant growth and normal  amniotic fluid. Patient without complaints at 24 weeks.  Patient with  increased complaints of pelvic pressure at 26 weeks as well as pain with  walking. At that time it was felt the patient had a pubic symphysis strain.  Patient was advised to begin use of a maternity support belt at that time.  Fetal fibronectin was also obtained at 26 weeks and 4 days which returned  with negative results. Patient was placed on bedrest for 1 week due to  complaints of pelvic pressure. Patient with repeat ultrasound at 28 weeks  with concordant growth and estimated fetal weight at the 73rd and 75th   percentile for twins A and B respectively.  Cervix 3.16. Patient continued to complain of irregular contractions as well  as pelvic pain and pressure and shortness of breath with walking. Patient  also noted to have weight loss at 28 weeks as well which has been  persistent. The patient was again seen at 28 weeks for complaints of nasal  congestion as well as fatigue, nausea and vomiting and increased heart burn.  At that time patient was recommended to use Sudafed for nasal congestion.  The patient was given a prescription and started on Nexium at that time for  GERD symptoms, as well as Phenergan. TSH was checked at that time due to the  weight loss as well as CBC with a hemoglobin of 10.0.  At 29 weeks patient  called and requested to be taken out of work. At 30 weeks patient's cervix  continued to be closed and patient with decreased pelvic pain when resting  at home. Ultrasound at 31 weeks revealed concordant growth of both twins  with estimated fetal weight at the 72nd and 74th percentile respectively.  Normal fluid and vertex, vertex presentation.  At 32 weeks patient continued to complain of pelvic pressure. The patient  has continued to have issues with pelvic pressure throughout the remainder  of the pregnancy.  Remaining ultrasounds have been within normal limits for  concordant growth.   OBSTETRIC HISTORY:  Pregnancy number one is current.   GYN HISTORY:  The patient took plan B in February of 2007, otherwise  negative contraceptive history. The patient denies history of abnormal Pap  smears or STDs.  The patient reports regular monthly menses with no significant issues.   PAST MEDICAL HISTORY:  History of migraines, otherwise negative.   ALLERGIES:  PENICILLIN - causes swelling.   CURRENT MEDICATIONS:  Prenatal vitamins.   FAMILY HISTORY:  Father with myocardial infarction, chronic hypertension, diabetes. Paternal grandfather with coronary artery disease. Maternal   grandmother with diabetes, renal disease. Mother with thyroid disease, renal  lithiasis as well as migraines. Maternal grandfather with Alzheimer's  disease. Paternal grandfather with Alzheimer's disease. Paternal grandmother  with breast cancer.   GENETIC HISTORY:  Negative.   SOCIAL HISTORY:  The patient is single. Father of the baby, Lupita Shutter is  involved and supportive. Patient with 16 years of education and works in  [employer].  Father of the baby with 12 years of education and is in the [employer].  Patient reports that she is [place] in her faith. Patient denies use of  alcohol, tobacco or street drugs.   REVIEW OF SYSTEMS:  Is typical of one at term with a twin  gestation.   PHYSICAL EXAMINATION:  GENERAL:  The patient is afebrile, vital signs are  stable.  HEENT:  Within normal limits.  LUNGS:  Clear.  HEART:  Regular rate and rhythm.  BREASTS:  Soft.  ABDOMEN:  Soft, gravid and nontender. Fetal heart rate reactive x2 with  baselines 130's. Uterine contractions are noted to be irregular on  admission.  CERVICAL EXAM:  On admission by R. N. 1-2 cm.  EXTREMITIES:  Negative edema. Negative Homan's bilaterally.   ASSESSMENT:  1. Twin intrauterine pregnancy at 38-2/7 weeks.  2. Induction of labor.   PLAN:  1. Patient admitted to birthing suite per Dr. Osborn Coho.  2. Routine M.D. orders.  3. Cervidil to be placed on the evening of admission and low dose pitocin      will be started after Cervidil removed.      Rhona Leavens, CNM      Osborn Coho, M.D.  Electronically Signed    NOS/MEDQ  D:  05/30/2006  T:  05/30/2006  Job:  [id number]

## 2010-12-31 NOTE — Discharge Summary (Signed)
[name], [name]                  ACCOUNT NO.:  [id number]   MEDICAL RECORD NO.:  [id number]          PATIENT TYPE:  INP   LOCATION:  5114                         FACILITY:  [facility]   PHYSICIAN:  Crist Fat. Rivard, M.D. DATE OF BIRTH:  1975-10-13   DATE OF ADMISSION:  03/24/2008  DATE OF DISCHARGE:  04/02/2008                               DISCHARGE SUMMARY   REASON FOR ADMISSION:  Acute abdominal pain with bilateral tubo-ovarian  abscess.   HISTORY OF PRESENT ILLNESS:  This is a 35 year old single African  American female gravida 1, para 1 with live twins of 43 months of age  who was admitted by the [facility] Team on March 24, 2008, with acute  abdominal pain, similar to the previous pain with the previous admission  in May 2009.  This severe pain,predominantly in the left lower quadrant,  is associated with nausea and vomiting and when admitted the patient had  a temperature of 102.3.   In reviewing her medical history, we note a previous admission from  October 29, 2007 to November 03, 2007, at [facility] with a CT scan  of the pelvis compatible with an acute diverticulitis.  The patient was  treated with IV and p.o. antibiotic course and discharged on November 03, 2007.  At that time, imaging revealed a normal uterus and 2 normal  adnexa, but also some enlarged pelvic and inguinal nodes, felt to be  reactive to the acute diverticulitis process.   She was admitted again on December 09, 2007, at [facility] by Dr.  Jaymes Graff for acute pelvic pain, mainly for pain management.  At  that time, a pelvic ultrasound was normal except for the presence of  complex fluid in the pelvis.  A flat plate of the abdomen was negative  and the CBC was normal.  Within 24 hours of admission, the patient  rapidly became febrile and preseptic.  A CT scan on December 10, 2007,  revealed no abscess but improved diverticulitis compared to the previous  CT scan in March.  There is also note of  increased quantity of pelvic  fluid, and Dr. Baruch Merl and Dr. Evette Cristal were consulted.  The  impression at that time was again of diverticulitis, and the patient was  transferred to [facility] under the care of Dr. Baruch Merl.  A CT scan on Dec 17, 2007, revealed increase in pelvic fluid with now 2  pelvic abscess formation measuring respectively 5 x 14 cm and 4 x 9.9  cm.  This also was associated with confluence to the left side of the  uterus with 2 small collections below the cecum and anterior in the  pelvis.  The patient underwent CT scan guided drainage, both on Dec 17, 2007, and Dec 19, 2007.  She was then discharged with oral antibiotics to  be completed over the next 7 days and was scheduled for an outpatient CT  scan on Dec 26, 2007, which she had at [facility].  That CT scan  revealed no residual fluid cavity with a normal  uterus.  Under CT  guidance, contrast was injected in both drains which revealed a proper  placement of the drains in the pelvic cavity with filling of the  peritoneal cavity, but also filling of both tubes and uterus.  There was  no identification of fistula with the colon and drains were removed and  the patient was discharged.  She followed up with Dr. Lurene Shadow and had  another outpatient CT scan on February 11, 2008, which revealed a normal  abdominal study, a complex left ovarian cyst measuring 4.7 cm, the  presence of the same inguinal nodes, no residual abscess, and no  evidence of diverticulitis.  She was sent for followup in our office  where we performed a pelvic ultrasound on February 13, 2008.  That ultrasound  revealed a normal uterus and normal right ovary and a left ovarian  complex cyst measuring 4 x 4.5 cm compatible with hemorrhagic cyst with  normal Doppler flow and recommendation was made for follow up in 6 to 8  weeks.   We are now at this current admission on March 24, 2008.  A CT scan on  March 25, 2008, revealed a right  pelvic abscess extending to the left  pelvis.  A pelvic ultrasound on the same day revealed normal uterus and  2 normal ovaries, but the pelvic organs are surrounded by echogenic  material compatible with pus.  On March 26, 2008, a barium enema was  negative for diverticula and decision was made by Dr. Zachery Dakins to  proceed with an exploratory laparotomy.  The patient had been previously  started on ertapenem IV antibiotics on March 24, 2008, and was changed  to vancomycin on March 25, 2008, with added Primaxin on August 12.  These recommendations were made by Infectious Disease consultants.   HOSPITAL COURSE:  On March 26, 2008, the patient underwent an  exploratory laparotomy with removal of a right pelvic abscess and  appendectomy by Dr. Zachery Dakins as well as a total abdominal hysterectomy,  left salpingo-oophorectomy, and right salpingectomy by Dr. Estanislado Pandy after  concluding the etiology of the pelvic abscesses were bilateral tubo-  ovarian abscess.  Her postoperative course was uneventful and the  patient became afebrile, 48 hours after surgery.  A culture from the  fluid of the abscess returned with Klebsiella pneumoniae sensitive to  everything and Infectious Disease consultants recommended changing  antibiotics to Unasyn 3 g IV q.6 hours.  She continued improving daily,  but reported some symptoms of depression for which a consultation was  requested.  Dr. Jeanie Sewer saw [name] on March 31, 2008, and concluded  to depression and recommended to start  Celexa 10 mg daily, which the  patient was agreeable to.  Forty-eight hours postoperatively, clear  liquid diet was reintroduced.  Five days postoperatively, normal diet  was reintroduced.  On April 02, 2008, the patient was feeling better,  pain was well controlled with oral pain management.  She was ambulating,  voiding normally, having normal bowel movements, and had remained  afebrile with a normal CBC including a white blood  count, which was  normal.  She did have mild anemia with a hemoglobin of 8, which had  remained stable postoperatively.  She was deemed to have received the  full benefit of her admission and was discharged home on April 02, 2008.   PATHOLOGY REPORT:  1. No acute appendicitis.  2. Pelvic wall abscess.  3. Tubes, ovary, and uterus compatible with a chronic pelvic  inflammatory disease.   RECOMMENDATION AT DISCHARGE:  The patient is discharged with Augmentin  875 mg twice daily for 3 weeks, Toradol 10 mg every 6 hours as needed  for pain, Dilaudid 2 mg every 6 hours as needed for pain, Colace twice  daily while using iron supplement and/or pain medication, Nu-Iron 150 mg  twice daily for 2 months, and Celexa 20 mg orally daily.   FOLLOWUP APPOINTMENT:  Dr. Estanislado Pandy, April 17, 2008, at 12:15, and the  patient is instructed to call [facility] to follow up as an  outpatient.   Her discharge instructions have been reviewed by Dr. Estanislado Pandy and the  discharging nurse.      Crist Fat Rivard, M.D.  Electronically Signed     SAR/MEDQ  D:  04/04/2008  T:  04/05/2008  Job:  [id number]

## 2010-12-31 NOTE — Op Note (Signed)
[name], [name]                              ACCOUNT NO.:  [id number]   MEDICAL RECORD NO.:  [id number]                   PATIENT TYPE:  OIB   LOCATION:  2855                                 FACILITY:  [facility]   PHYSICIAN:  Thornton Park. Daphine Deutscher, M.D.             DATE OF BIRTH:  19-Dec-1975   DATE OF PROCEDURE:  01/22/2003  DATE OF DISCHARGE:                                 OPERATIVE REPORT   PREOPERATIVE DIAGNOSIS:   POSTOPERATIVE DIAGNOSIS:   OPERATION PERFORMED:  Laparoscopic cholecystectomy with intraoperative  cholangiogram.   SURGEON:  Thornton Park. Daphine Deutscher, M.D.   ASSISTANT:  Gita Kudo, M.D.   ANESTHESIA:  General endotracheal.   OPERATIVE FINDINGS:  Multiple large faceted gallstones and evidence of  inflammatory changes near the infundibulum and cystic duct, normal  intraoperative cholangiogram.   INDICATIONS FOR PROCEDURE:  The patient is a 35 year old female with at  least a month history of increasing nausea recently having more abdominal  pain in the midepigastric and right upper quadrant.  Gallbladder ultrasound  showed numerous gallstones.   DESCRIPTION OF PROCEDURE:  [name] was taken to room 16 and given general  anesthesia.  The abdomen was prepped widely with Betadine and draped  sterilely.  A longitudinal incision was made down into her umbilicus and the  Hasson was inserted and the abdomen was insufflated.  Three trocars were  placed in the upper abdomen and surveyed the abdomen.  No other  abnormalities were noted.  The gallbladder was large and slightly dilated.  It was grasped and elevated.  There seemed to be numerous adhesions down to  the infundibulum which I took down.  Some of these appeared more chronic  indicative of chronic cholecystitis.  I then isolated a cystic artery,  cystic duct combination and put a clip up on the gallbladder side.  I  incised the artery and had to put a clip on that separately and then incised  the cystic duct.  The  duct was small and had some yellowish mucus in it.  I  cannulated this with a Reddick catheter, did a dynamic cholangiogram which  showed intrahepatic filling of right and left ducts and prompt flow into the  duodenum with the small common bile duct.  The cystic duct was then triple  clipped and divided.  I then removed the gallbladder without injuring it  using the hook electrocautery.  She had numerous little vessels along the  posterior wall which I double clipped.  Once it was detached, I went back  and looked in the gallbladder bed and no bleeding or bile leaks were noted.  The gallbladder was removed from the umbilicus where I decompressed it using  stone forceps and found multiple at least 1 cm gallstones.  It was my  impression that she had significant gallbladder disease which likely has  recently come to a head with the  pain, nausea, vomiting.  I surveyed the  gallbladder bed again.  I repaired the umbilical defect with a single 0  Vicryl which closed the small hole completely.  The abdomen was reinflated  and this area was examined and then I examined the port sites.  I injected  the port sites with Marcaine and then deflated the abdomen.  The wounds were  closed with 4-0 Vicryl, benzoin and Steri-Strips.  The patient seemed to  tolerate the procedure well and was taken to the recovery room in  satisfactory condition.                                                Thornton Park Daphine Deutscher, M.D.    MBM/MEDQ  D:  01/22/2003  T:  01/22/2003  Job:  [id number]   cc:   Erskine Speed, M.D.  [address]  [place]  [place] [address]  Fax: [phone]

## 2011-05-09 LAB — I-STAT 8, (EC8 V) (CONVERTED LAB)
BUN: 7
Glucose, Bld: 88
Hemoglobin: 14.6
Potassium: 3.5
Sodium: 138
TCO2: 24
pH, Ven: 7.361 — ABNORMAL HIGH

## 2011-05-09 LAB — HEPATIC FUNCTION PANEL
ALT: 16
Alkaline Phosphatase: 19 — ABNORMAL LOW
Indirect Bilirubin: 0.6
Total Bilirubin: 0.7
Total Protein: 7.2

## 2011-05-09 LAB — BASIC METABOLIC PANEL
BUN: 2 — ABNORMAL LOW
BUN: 3 — ABNORMAL LOW
CO2: 26
Calcium: 8.7
Chloride: 105
GFR calc non Af Amer: >60
Glucose, Bld: 85
Potassium: 3.7
Sodium: 135
Sodium: 137

## 2011-05-09 LAB — DIFFERENTIAL
Basophils Absolute: 0
Eosinophils Absolute: 0.1
Eosinophils Relative: 0
Eosinophils Relative: 2
Lymphocytes Relative: 11 — ABNORMAL LOW
Lymphocytes Relative: 33
Lymphs Abs: 1.2
Lymphs Abs: 2
Monocytes Absolute: 0.7
Monocytes Absolute: 1.1 — ABNORMAL HIGH
Monocytes Relative: 12
Monocytes Relative: 9
Neutro Abs: 9.1 — ABNORMAL HIGH

## 2011-05-09 LAB — CBC
HCT: 31.8 — ABNORMAL LOW
HCT: 32 — ABNORMAL LOW
HCT: 37.4
Hemoglobin: 10.9 — ABNORMAL LOW
Hemoglobin: 10.9 — ABNORMAL LOW
Hemoglobin: 12.7
MCHC: 34.4
MCV: 96.5
Platelets: 242
Platelets: 257
RBC: 3.32 — ABNORMAL LOW
RBC: 3.92
RDW: 12.5
RDW: 12.9
WBC: 5.9

## 2011-05-09 LAB — URINALYSIS, ROUTINE W REFLEX MICROSCOPIC
Bilirubin Urine: NEGATIVE
Nitrite: NEGATIVE
Protein, ur: NEGATIVE
Specific Gravity, Urine: 1.026
Urobilinogen, UA: 0.2

## 2011-05-09 LAB — CLOSTRIDIUM DIFFICILE EIA: C difficile Toxins A+B, EIA: NEGATIVE

## 2011-05-09 LAB — MAGNESIUM: Magnesium: 2.1

## 2011-05-09 LAB — URINE MICROSCOPIC-ADD ON

## 2011-05-09 LAB — POCT PREGNANCY, URINE: Operator id: 277751

## 2011-05-09 LAB — POCT I-STAT CREATININE: Operator id: 272551

## 2011-05-09 LAB — WET PREP, GENITAL
Clue Cells Wet Prep HPF POC: NONE SEEN
Trich, Wet Prep: NONE SEEN
WBC, Wet Prep HPF POC: NONE SEEN
Yeast Wet Prep HPF POC: NONE SEEN

## 2011-05-09 LAB — RPR: RPR Ser Ql: NONREACTIVE

## 2011-05-10 LAB — COMPREHENSIVE METABOLIC PANEL
ALT: 11
Alkaline Phosphatase: 15 — ABNORMAL LOW
Alkaline Phosphatase: 17 — ABNORMAL LOW
BUN: 7
CO2: 24
CO2: 26
Chloride: 102
Chloride: 104
GFR calc non Af Amer: >60
GFR calc non Af Amer: >60
Glucose, Bld: 119 — ABNORMAL HIGH
Glucose, Bld: 91
Potassium: 3.1 — ABNORMAL LOW
Potassium: 3.6
Sodium: 133 — ABNORMAL LOW
Total Bilirubin: 1.4 — ABNORMAL HIGH

## 2011-05-10 LAB — BASIC METABOLIC PANEL
BUN: 2 — ABNORMAL LOW
BUN: 3 — ABNORMAL LOW
Calcium: 7.8 — ABNORMAL LOW
Creatinine, Ser: 0.78
GFR calc non Af Amer: >60
GFR calc non Af Amer: >60
Potassium: 4

## 2011-05-10 LAB — CBC
HCT: 30.1 — ABNORMAL LOW
HCT: 30.3 — ABNORMAL LOW
Hemoglobin: 10.5 — ABNORMAL LOW
Hemoglobin: 12.7
Hemoglobin: 9.9 — ABNORMAL LOW
MCV: 95.6
Platelets: 265
Platelets: 304
RBC: 3.07 — ABNORMAL LOW
RBC: 3.82 — ABNORMAL LOW
RDW: 13.6
WBC: 13.6 — ABNORMAL HIGH
WBC: 17.3 — ABNORMAL HIGH
WBC: 17.8 — ABNORMAL HIGH
WBC: 9.4

## 2011-05-10 LAB — DIFFERENTIAL
Basophils Absolute: 0
Basophils Relative: 0
Monocytes Absolute: 0.6
Neutro Abs: 15.6 — ABNORMAL HIGH
Neutrophils Relative %: 90 — ABNORMAL HIGH

## 2011-05-10 LAB — LACTIC ACID, PLASMA: Lactic Acid, Venous: 0.8

## 2011-05-10 LAB — URINE MICROSCOPIC-ADD ON

## 2011-05-10 LAB — AMYLASE: Amylase: 72

## 2011-05-10 LAB — URINE CULTURE

## 2011-05-10 LAB — CULTURE, BLOOD (ROUTINE X 2)
Culture: NO GROWTH
Culture: NO GROWTH

## 2011-05-10 LAB — MAGNESIUM
Magnesium: 1.6
Magnesium: 1.8

## 2011-05-10 LAB — URINALYSIS, ROUTINE W REFLEX MICROSCOPIC
Leukocytes, UA: NEGATIVE
Nitrite: NEGATIVE
Specific Gravity, Urine: >1.03 — ABNORMAL HIGH
pH: 5.5

## 2011-05-10 LAB — HCG, QUANTITATIVE, PREGNANCY: hCG, Beta Chain, Quant, S: <2

## 2011-05-13 LAB — COMPREHENSIVE METABOLIC PANEL
ALT: 14
ALT: 16
ALT: 21
AST: 14
AST: 34
Albumin: 3.5
Alkaline Phosphatase: 24 — ABNORMAL LOW
BUN: 3 — ABNORMAL LOW
CO2: 22
Calcium: 8.4
Calcium: 9.2
GFR calc Af Amer: >60
GFR calc non Af Amer: >60
Glucose, Bld: 107 — ABNORMAL HIGH
Glucose, Bld: 93
Potassium: 3.2 — ABNORMAL LOW
Sodium: 133 — ABNORMAL LOW
Sodium: 134 — ABNORMAL LOW
Sodium: 136
Total Protein: 7.4
Total Protein: 8.9 — ABNORMAL HIGH

## 2011-05-13 LAB — DIFFERENTIAL
Basophils Absolute: 0
Basophils Absolute: 0
Basophils Relative: 0
Basophils Relative: 0
Eosinophils Absolute: 0
Eosinophils Absolute: 0
Eosinophils Relative: 0
Eosinophils Relative: 0
Lymphocytes Relative: 21
Lymphocytes Relative: 8 — ABNORMAL LOW
Lymphs Abs: 0.9
Monocytes Absolute: 0.7
Monocytes Absolute: 0.7
Monocytes Relative: 6
Monocytes Relative: 6
Neutro Abs: 10.4 — ABNORMAL HIGH
Neutro Abs: 4.7
Neutrophils Relative %: 66
Neutrophils Relative %: 87 — ABNORMAL HIGH
Neutrophils Relative %: 91 — ABNORMAL HIGH

## 2011-05-13 LAB — URINALYSIS, ROUTINE W REFLEX MICROSCOPIC
Bilirubin Urine: NEGATIVE
Glucose, UA: NEGATIVE
Hgb urine dipstick: NEGATIVE
Ketones, ur: NEGATIVE
Nitrite: NEGATIVE
Protein, ur: NEGATIVE
Specific Gravity, Urine: 1.028
Urobilinogen, UA: 1
pH: 6

## 2011-05-13 LAB — CULTURE, BLOOD (ROUTINE X 2)

## 2011-05-13 LAB — POCT I-STAT 4, (NA,K, GLUC, HGB,HCT)
Glucose, Bld: 102 — ABNORMAL HIGH
HCT: 29 — ABNORMAL LOW
Hemoglobin: 9.9 — ABNORMAL LOW
Potassium: 3.4 — ABNORMAL LOW
Sodium: 138

## 2011-05-13 LAB — CBC
HCT: 28 — ABNORMAL LOW
HCT: 30.1 — ABNORMAL LOW
HCT: 31.6 — ABNORMAL LOW
HCT: 36.2
Hemoglobin: 10.3 — ABNORMAL LOW
Hemoglobin: 10.9 — ABNORMAL LOW
Hemoglobin: 10.9 — ABNORMAL LOW
Hemoglobin: 11.2 — ABNORMAL LOW
Hemoglobin: 12.3
Hemoglobin: 8 — ABNORMAL LOW
MCHC: 33.9
MCHC: 33.9
MCHC: 34.1
MCV: 94.4
Platelets: 241
Platelets: 274
Platelets: 400
RBC: 3.34 — ABNORMAL LOW
RBC: 3.39 — ABNORMAL LOW
RBC: 3.84 — ABNORMAL LOW
RDW: 14
RDW: 14
RDW: 14.1
RDW: 14.7
RDW: 14.8
WBC: 12 — ABNORMAL HIGH
WBC: 12.4 — ABNORMAL HIGH
WBC: 16 — ABNORMAL HIGH

## 2011-05-13 LAB — GLUCOSE, CAPILLARY: Glucose-Capillary: 101 — ABNORMAL HIGH

## 2011-05-13 LAB — COMPREHENSIVE METABOLIC PANEL WITH GFR
Alkaline Phosphatase: 28 — ABNORMAL LOW
BUN: 6
CO2: 25
Chloride: 101
Creatinine, Ser: 0.67
GFR calc non Af Amer: >60
Glucose, Bld: 110 — ABNORMAL HIGH
Potassium: 3.8
Total Bilirubin: 0.6

## 2011-05-13 LAB — BASIC METABOLIC PANEL
BUN: 6
Calcium: 7.2 — ABNORMAL LOW
Calcium: 7.5 — ABNORMAL LOW
Creatinine, Ser: 0.49
GFR calc non Af Amer: >60
GFR calc non Af Amer: >60
Glucose, Bld: 101 — ABNORMAL HIGH
Glucose, Bld: 87
Potassium: 3.7
Sodium: 138

## 2011-05-13 LAB — ANAEROBIC CULTURE

## 2011-05-13 LAB — CULTURE, ROUTINE-ABSCESS

## 2011-05-13 LAB — PHOSPHORUS: Phosphorus: 2.9

## 2011-05-13 LAB — MAGNESIUM: Magnesium: 1.5

## 2011-05-13 LAB — GRAM STAIN

## 2011-05-13 LAB — TYPE AND SCREEN
ABO/RH(D): A POS
Antibody Screen: NEGATIVE

## 2011-05-13 LAB — PREGNANCY, URINE: Preg Test, Ur: NEGATIVE

## 2011-05-13 LAB — LIPASE, BLOOD
Lipase: 13
Lipase: 16

## 2011-05-13 LAB — CULTURE, BLOOD (SINGLE): Culture: NO GROWTH

## 2011-05-13 LAB — ABO/RH: ABO/RH(D): A POS

## 2011-05-20 LAB — URINALYSIS, MICROSCOPIC ONLY
Leukocytes, UA: NEGATIVE
Nitrite: NEGATIVE
Specific Gravity, Urine: 1.025 (ref 1.005–1.030)
Urobilinogen, UA: 0.2 mg/dL (ref 0.0–1.0)
pH: 6 (ref 5.0–8.0)

## 2011-05-20 LAB — CBC
HCT: 28.1 % — ABNORMAL LOW (ref 36.0–46.0)
HCT: 30.6 % — ABNORMAL LOW (ref 36.0–46.0)
HCT: 31.5 % — ABNORMAL LOW (ref 36.0–46.0)
HCT: 32.1 % — ABNORMAL LOW (ref 36.0–46.0)
Hemoglobin: 10.4 g/dL — ABNORMAL LOW (ref 12.0–15.0)
Hemoglobin: 10.5 g/dL — ABNORMAL LOW (ref 12.0–15.0)
Hemoglobin: 10.9 g/dL — ABNORMAL LOW (ref 12.0–15.0)
MCHC: 32.8 g/dL (ref 30.0–36.0)
MCHC: 33.1 g/dL (ref 30.0–36.0)
MCHC: 33.7 g/dL (ref 30.0–36.0)
MCV: 93.7 fL (ref 78.0–100.0)
MCV: 94.1 fL (ref 78.0–100.0)
MCV: 94.8 fL (ref 78.0–100.0)
MCV: 95.1 fL (ref 78.0–100.0)
MCV: 95.5 fL (ref 78.0–100.0)
MCV: 95.6 fL (ref 78.0–100.0)
Platelets: 247 10*3/uL (ref 150–400)
Platelets: 254 10*3/uL (ref 150–400)
Platelets: 424 10*3/uL — ABNORMAL HIGH (ref 150–400)
RBC: 2.95 MIL/uL — ABNORMAL LOW (ref 3.87–5.11)
RBC: 3.23 MIL/uL — ABNORMAL LOW (ref 3.87–5.11)
RBC: 3.3 MIL/uL — ABNORMAL LOW (ref 3.87–5.11)
RBC: 3.32 MIL/uL — ABNORMAL LOW (ref 3.87–5.11)
RBC: 3.38 MIL/uL — ABNORMAL LOW (ref 3.87–5.11)
RBC: 3.44 MIL/uL — ABNORMAL LOW (ref 3.87–5.11)
RDW: 13.3 % (ref 11.5–15.5)
RDW: 13.6 % (ref 11.5–15.5)
RDW: 13.8 % (ref 11.5–15.5)
WBC: 11.7 10*3/uL — ABNORMAL HIGH (ref 4.0–10.5)
WBC: 15.5 10*3/uL — ABNORMAL HIGH (ref 4.0–10.5)
WBC: 15.8 10*3/uL — ABNORMAL HIGH (ref 4.0–10.5)
WBC: 19.5 10*3/uL — ABNORMAL HIGH (ref 4.0–10.5)
WBC: 6.2 10*3/uL (ref 4.0–10.5)
WBC: 9.6 10*3/uL (ref 4.0–10.5)

## 2011-05-20 LAB — BASIC METABOLIC PANEL
BUN: 12 mg/dL (ref 6–23)
BUN: 3 mg/dL — ABNORMAL LOW (ref 6–23)
BUN: 6 mg/dL (ref 6–23)
CO2: 23 mEq/L (ref 19–32)
CO2: 24 mEq/L (ref 19–32)
CO2: 25 mEq/L (ref 19–32)
Calcium: 7.4 mg/dL — ABNORMAL LOW (ref 8.4–10.5)
Calcium: 7.7 mg/dL — ABNORMAL LOW (ref 8.4–10.5)
Calcium: 8.7 mg/dL (ref 8.4–10.5)
Calcium: 8.9 mg/dL (ref 8.4–10.5)
Creatinine, Ser: 0.67 mg/dL (ref 0.4–1.2)
GFR calc Af Amer: >60 mL/min (ref >60)
GFR calc Af Amer: >60 mL/min (ref >60)
GFR calc non Af Amer: >60 mL/min (ref >60)
GFR calc non Af Amer: >60 mL/min (ref >60)
Glucose, Bld: 100 mg/dL — ABNORMAL HIGH (ref 70–99)
Glucose, Bld: 111 mg/dL — ABNORMAL HIGH (ref 70–99)
Glucose, Bld: 164 mg/dL — ABNORMAL HIGH (ref 70–99)
Potassium: 3.2 mEq/L — ABNORMAL LOW (ref 3.5–5.1)
Sodium: 135 mEq/L (ref 135–145)
Sodium: 137 mEq/L (ref 135–145)

## 2011-05-20 LAB — DIFFERENTIAL
Basophils Absolute: 0 10*3/uL (ref 0.0–0.1)
Basophils Absolute: 0 10*3/uL (ref 0.0–0.1)
Basophils Absolute: 0 10*3/uL (ref 0.0–0.1)
Basophils Relative: 0 % (ref 0–1)
Basophils Relative: 0 % (ref 0–1)
Eosinophils Absolute: 0 10*3/uL (ref 0.0–0.7)
Eosinophils Absolute: 0 10*3/uL (ref 0.0–0.7)
Eosinophils Absolute: 0.1 10*3/uL (ref 0.0–0.7)
Eosinophils Absolute: 0.2 10*3/uL (ref 0.0–0.7)
Eosinophils Relative: 0 % (ref 0–5)
Eosinophils Relative: 0 % (ref 0–5)
Lymphocytes Relative: 4 % — ABNORMAL LOW (ref 12–46)
Lymphocytes Relative: 5 % — ABNORMAL LOW (ref 12–46)
Lymphs Abs: 0.6 10*3/uL — ABNORMAL LOW (ref 0.7–4.0)
Lymphs Abs: 0.9 10*3/uL (ref 0.7–4.0)
Lymphs Abs: 1.3 10*3/uL (ref 0.7–4.0)
Monocytes Absolute: 0.4 10*3/uL (ref 0.1–1.0)
Monocytes Absolute: 0.6 10*3/uL (ref 0.1–1.0)
Monocytes Absolute: 0.6 10*3/uL (ref 0.1–1.0)
Monocytes Absolute: 1.1 10*3/uL — ABNORMAL HIGH (ref 0.1–1.0)
Monocytes Relative: 1 % — ABNORMAL LOW (ref 3–12)
Monocytes Relative: 4 % (ref 3–12)
Monocytes Relative: 4 % (ref 3–12)
Monocytes Relative: 6 % (ref 3–12)
Monocytes Relative: 6 % (ref 3–12)
Monocytes Relative: 7 % (ref 3–12)
Neutro Abs: 14.5 10*3/uL — ABNORMAL HIGH (ref 1.7–7.7)
Neutro Abs: 8.8 10*3/uL — ABNORMAL HIGH (ref 1.7–7.7)
Neutro Abs: 9.4 10*3/uL — ABNORMAL HIGH (ref 1.7–7.7)
Neutrophils Relative %: 84 % — ABNORMAL HIGH (ref 43–77)
Neutrophils Relative %: 94 % — ABNORMAL HIGH (ref 43–77)

## 2011-05-20 LAB — CULTURE, BLOOD (ROUTINE X 2): Culture: NO GROWTH

## 2011-05-20 LAB — GLUCOSE, CAPILLARY
Glucose-Capillary: 104 mg/dL — ABNORMAL HIGH (ref 70–99)
Glucose-Capillary: 106 mg/dL — ABNORMAL HIGH (ref 70–99)
Glucose-Capillary: 110 mg/dL — ABNORMAL HIGH (ref 70–99)
Glucose-Capillary: 122 mg/dL — ABNORMAL HIGH (ref 70–99)
Glucose-Capillary: 129 mg/dL — ABNORMAL HIGH (ref 70–99)
Glucose-Capillary: 148 mg/dL — ABNORMAL HIGH (ref 70–99)
Glucose-Capillary: 149 mg/dL — ABNORMAL HIGH (ref 70–99)
Glucose-Capillary: 151 mg/dL — ABNORMAL HIGH (ref 70–99)
Glucose-Capillary: 153 mg/dL — ABNORMAL HIGH (ref 70–99)
Glucose-Capillary: 163 mg/dL — ABNORMAL HIGH (ref 70–99)
Glucose-Capillary: 168 mg/dL — ABNORMAL HIGH (ref 70–99)
Glucose-Capillary: 98 mg/dL (ref 70–99)

## 2011-05-20 LAB — PROTIME-INR
INR: 1 (ref 0.00–1.49)
Prothrombin Time: 13.8 seconds (ref 11.6–15.2)
Prothrombin Time: 14.1 seconds (ref 11.6–15.2)

## 2011-05-20 LAB — COMPREHENSIVE METABOLIC PANEL
ALT: 14 U/L (ref 0–35)
AST: 16 U/L (ref 0–37)
AST: 27 U/L (ref 0–37)
Albumin: 2.3 g/dL — ABNORMAL LOW (ref 3.5–5.2)
Albumin: 2.6 g/dL — ABNORMAL LOW (ref 3.5–5.2)
Albumin: 3 g/dL — ABNORMAL LOW (ref 3.5–5.2)
Alkaline Phosphatase: 22 U/L — ABNORMAL LOW (ref 39–117)
BUN: 11 mg/dL (ref 6–23)
CO2: 25 mEq/L (ref 19–32)
Calcium: 8.3 mg/dL — ABNORMAL LOW (ref 8.4–10.5)
Calcium: 8.5 mg/dL (ref 8.4–10.5)
Chloride: 102 mEq/L (ref 96–112)
Chloride: 105 mEq/L (ref 96–112)
Chloride: 107 mEq/L (ref 96–112)
Creatinine, Ser: 0.62 mg/dL (ref 0.4–1.2)
Creatinine, Ser: 0.65 mg/dL (ref 0.4–1.2)
Creatinine, Ser: 0.71 mg/dL (ref 0.4–1.2)
GFR calc Af Amer: >60 mL/min (ref >60)
GFR calc Af Amer: >60 mL/min (ref >60)
GFR calc non Af Amer: >60 mL/min (ref >60)
Glucose, Bld: 125 mg/dL — ABNORMAL HIGH (ref 70–99)
Potassium: 3.2 mEq/L — ABNORMAL LOW (ref 3.5–5.1)
Potassium: 3.5 mEq/L (ref 3.5–5.1)
Sodium: 136 mEq/L (ref 135–145)
Total Bilirubin: 0.3 mg/dL (ref 0.3–1.2)
Total Bilirubin: 0.7 mg/dL (ref 0.3–1.2)
Total Bilirubin: 1.2 mg/dL (ref 0.3–1.2)
Total Protein: 6.3 g/dL (ref 6.0–8.3)
Total Protein: 7.3 g/dL (ref 6.0–8.3)
Total Protein: 8.1 g/dL (ref 6.0–8.3)

## 2011-05-20 LAB — MPO/PR-3 (ANCA) ANTIBODIES

## 2011-05-20 LAB — APTT: aPTT: 35 seconds (ref 24–37)

## 2011-05-20 LAB — CULTURE, BLOOD (SINGLE)

## 2011-05-20 LAB — ANAEROBIC CULTURE

## 2011-05-20 LAB — ANTI-NEUTROPHIL ANTIBODY

## 2011-05-20 LAB — CLOSTRIDIUM DIFFICILE EIA: C difficile Toxins A+B, EIA: NEGATIVE

## 2011-05-20 LAB — MISCELLANEOUS TEST

## 2011-05-20 LAB — PHOSPHORUS: Phosphorus: 3.5 mg/dL (ref 2.3–4.6)

## 2011-05-20 LAB — GENTAMICIN LEVEL, RANDOM: Gentamicin Rm: 0.5 ug/mL

## 2011-05-20 LAB — CULTURE, ROUTINE-ABSCESS: Culture: NO GROWTH

## 2011-05-20 LAB — MAGNESIUM: Magnesium: 2 mg/dL (ref 1.5–2.5)

## 2011-05-20 LAB — C-REACTIVE PROTEIN: CRP: 1.4 mg/dL — ABNORMAL HIGH (ref <0.6)

## 2011-05-20 LAB — TRIGLYCERIDES: Triglycerides: 150 mg/dL — ABNORMAL HIGH (ref <150)

## 2011-05-20 LAB — URINE CULTURE

## 2011-07-14 ENCOUNTER — Emergency Department (INDEPENDENT_AMBULATORY_CARE_PROVIDER_SITE_OTHER)
Admission: EM | Admit: 2011-07-14 | Discharge: 2011-07-14 | Disposition: A | Discharge status: Home / Self Care | Payer: 59 | Source: Home / Self Care

## 2011-07-14 ENCOUNTER — Encounter: Payer: Self-pay | Admitting: Emergency Medicine

## 2011-07-14 DIAGNOSIS — B9789 Other viral agents as the cause of diseases classified elsewhere: Secondary | ICD-10-CM

## 2011-07-14 DIAGNOSIS — B349 Viral infection, unspecified: Secondary | ICD-10-CM

## 2011-07-14 HISTORY — DX: Diverticulitis of intestine, part unspecified, without perforation or abscess without bleeding: K57.92

## 2011-07-14 MED ORDER — PROMETHAZINE-CODEINE 6.25-10 MG/5ML PO SYRP: ORAL_SOLUTION | ORAL | Status: AC

## 2011-07-14 NOTE — ED Notes (Signed)
Chest congestion, nasal congestion on 07/12/2011.  Cough, fever at night: 102.  Slight dizziness, no nausea, no vomiting, hurts in chest with cough

## 2011-07-14 NOTE — ED Notes (Signed)
Reviewed instructions and script for promethazine and codeine, provided work note as instructed by dawn, pa

## 2011-07-14 NOTE — ED Notes (Signed)
[name] specified patient to return to work on Monday.

## 2011-07-14 NOTE — ED Notes (Signed)
Patient was given blankets.

## 2011-07-14 NOTE — ED Provider Notes (Signed)
History     CSN: [id number] Arrival date & time: 07/14/2011  1:49 PM   None     Chief Complaint  Patient presents with  . Nasal Congestion    (Consider location/radiation/quality/duration/timing/severity/associated sxs/prior treatment) HPI Comments: Onset 2 days ago of fever, cough, body aches, HA, nasal congestion, sore throat and cough. Cough is sometimes productive with small amount of yellowish phlegm. Symptoms are worse in the evening. Pt states she has been exposed to sick co-workers, with various illnesses, some with coughs. She has tried 2 different otc multi symptom cold products without relief.  Patient is a 35 y.o. female presenting with cough.  Cough This is a new problem. The current episode started 2 days ago. The problem occurs every few minutes. The problem has not changed since onset.The cough is productive of sputum. The maximum temperature recorded prior to her arrival was 102 to 102.9 F. Associated symptoms include chills, headaches, rhinorrhea, sore throat and myalgias. Pertinent negatives include no chest pain, no ear pain, no shortness of breath and no wheezing. She has tried decongestants for the symptoms. The treatment provided no relief. Her past medical history does not include asthma.    Past Medical History  Diagnosis Date  . Diverticulitis     Past Surgical History  Procedure Date  . Cholecystectomy   . Small intestine surgery   . Abdominal hysterectomy     History reviewed. No pertinent family history.  History  Substance Use Topics  . Smoking status: Never Smoker   . Smokeless tobacco: Not on file  . Alcohol Use: No    OB History    Grav Para Term Preterm Abortions TAB SAB Ect Mult Living                  Review of Systems  Constitutional: Positive for fever, chills and fatigue.  HENT: Positive for congestion, sore throat and rhinorrhea. Negative for ear pain, sneezing, postnasal drip and sinus pressure.   Respiratory: Positive for  cough. Negative for shortness of breath and wheezing.   Cardiovascular: Negative for chest pain and palpitations.  Musculoskeletal: Positive for myalgias.  Neurological: Positive for headaches.    Allergies  Iohexol  Home Medications   Current Outpatient Rx  Name Route Sig Dispense Refill  . ALEVE PO Oral Take by mouth.      Marland Kitchen PROMETHAZINE-CODEINE 6.25-10 MG/5ML PO SYRP  1-2 tsp every 6 hrs prn cough 120 mL 0    BP 116/81  Pulse 85  Temp(Src) 98.5 F (36.9 C) (Oral)  Resp 18  SpO2 100%  Physical Exam  Nursing note and vitals reviewed. Constitutional: She appears well-developed and well-nourished. No distress.  HENT:  Head: Normocephalic and atraumatic.  Right Ear: Tympanic membrane, external ear and ear canal normal.  Left Ear: Tympanic membrane, external ear and ear canal normal.  Nose: Nose normal.  Mouth/Throat: Uvula is midline, oropharynx is clear and moist and mucous membranes are normal. No oropharyngeal exudate, posterior oropharyngeal edema or posterior oropharyngeal erythema.  Neck: Neck supple.  Cardiovascular: Normal rate, regular rhythm and normal heart sounds.   Pulmonary/Chest: Effort normal and breath sounds normal. No respiratory distress.  Lymphadenopathy:    She has no cervical adenopathy.  Neurological: She is alert.  Skin: Skin is warm and dry.  Psychiatric: She has a normal mood and affect.    ED Course  Procedures (including critical care time)  Labs Reviewed - No data to display No results found.   1. Viral illness  MDM          Melody Comas, PA 07/14/11 1455

## 2011-07-15 ENCOUNTER — Telehealth (HOSPITAL_COMMUNITY): Payer: Self-pay | Admitting: *Deleted

## 2011-07-15 NOTE — ED Notes (Signed)
Order obtained from [facility] PA for Tessalon 100 mg. # 30 1-2 caps every 8 hrs prn cough. Vassie Moselle 07/15/2011

## 2011-07-21 NOTE — ED Provider Notes (Signed)
Medical screening examination/treatment/procedure(s) were performed by non-physician practitioner and as supervising physician I was immediately available for consultation/collaboration.  LYKINS,KIMBERLY G  D.O.    Randa Spike, MD 07/21/11 [phone]

## 2012-06-06 ENCOUNTER — Encounter (HOSPITAL_COMMUNITY): Payer: Self-pay | Admitting: *Deleted

## 2012-06-06 ENCOUNTER — Emergency Department (HOSPITAL_COMMUNITY)
Admission: EM | Admit: 2012-06-06 | Discharge: 2012-06-06 | Disposition: A | Discharge status: Home / Self Care | Payer: 59 | Source: Home / Self Care | Attending: Emergency Medicine | Admitting: Emergency Medicine

## 2012-06-06 DIAGNOSIS — N390 Urinary tract infection, site not specified: Secondary | ICD-10-CM

## 2012-06-06 DIAGNOSIS — K115 Sialolithiasis: Secondary | ICD-10-CM

## 2012-06-06 LAB — POCT URINALYSIS DIP (DEVICE)
Bilirubin Urine: NEGATIVE
Ketones, ur: NEGATIVE mg/dL
Specific Gravity, Urine: >=1.03 (ref 1.005–1.030)
pH: 6.5 (ref 5.0–8.0)

## 2012-06-06 MED ORDER — CEPHALEXIN 500 MG PO CAPS: 500.0000 mg | ORAL_CAPSULE | Freq: Three times a day (TID) | ORAL | Status: AC

## 2012-06-06 NOTE — ED Notes (Signed)
l  swollwen  Gland    X  1  Day

## 2012-06-06 NOTE — ED Notes (Signed)
Pt  Has  Symptoms  Of  Frequent      painfull  Urination  With  Blood  Tinged   Material  -   She  denys  Any  Vaginal  Discharge   She  Walks  Upright with a   Fluid    Steady  Gait

## 2012-06-06 NOTE — ED Provider Notes (Signed)
History     CSN: [id number]  Arrival date & time 06/06/12  [name]   First MD Initiated Contact with Patient 06/06/12 1923      Chief Complaint  Patient presents with  . Urinary Tract Infection    (Consider location/radiation/quality/duration/timing/severity/associated sxs/prior treatment) HPI Comments: Patient presents urgent care this evening complaining of frequent and painful urination for several days she has observed some blood in her urine. Patient denies any flank pain nausea vomiting fevers or changes in appetite denies any vaginal discharge or bleeding. She also describes a for 1-2 days she has felt some swelling and tenderness under the angle of her jaw line on both sides [place] right side than on the left side. Denies any fevers cold-like symptoms or difficulty swallowing. She feels is some how today swelling is almost gone.  Patient is a 36 y.o. female presenting with urinary tract infection.  Urinary Tract Infection This is a new problem. The current episode started 2 days ago. The problem occurs constantly. The problem has been gradually worsening. Pertinent negatives include no abdominal pain. Exacerbated by: Urination. Nothing relieves the symptoms. Treatments tried: Took one azithromycin dose and taking naproxen.    Past Medical History  Diagnosis Date  . Diverticulitis     Past Surgical History  Procedure Date  . Cholecystectomy   . Small intestine surgery   . Abdominal hysterectomy     No family history on file.  History  Substance Use Topics  . Smoking status: Never Smoker   . Smokeless tobacco: Not on file  . Alcohol Use: No    OB History    Grav Para Term Preterm Abortions TAB SAB Ect Mult Living                  Review of Systems  Constitutional: Negative for fever, chills, activity change and appetite change.  HENT: Positive for sore throat. Negative for congestion, rhinorrhea, trouble swallowing, neck pain, neck stiffness and voice change.    Eyes: Negative for visual disturbance.  Gastrointestinal: Negative for abdominal pain.  Genitourinary: Positive for dysuria, urgency and frequency. Negative for hematuria, flank pain, vaginal bleeding, vaginal discharge, vaginal pain, menstrual problem and pelvic pain.  Skin: Negative for rash and wound.    Allergies  Iohexol  Home Medications   Current Outpatient Rx  Name Route Sig Dispense Refill  . CEPHALEXIN 500 MG PO CAPS Oral Take 1 capsule (500 mg total) by mouth 3 (three) times daily. 21 capsule 0  . ALEVE PO Oral Take by mouth.        BP 132/79  Pulse 60  Temp 98.4 F (36.9 C) (Oral)  Resp 16  SpO2 100%  Physical Exam  Nursing note and vitals reviewed. Constitutional: She appears well-developed and well-nourished.  HENT:  Head:    Neck: Neck supple. No JVD present. No thyromegaly present.  Cardiovascular: Exam reveals no gallop and no friction rub.   No murmur heard. Pulmonary/Chest: Effort normal.  Abdominal: Soft. She exhibits no distension and no mass. There is no tenderness. There is no rebound and no guarding.  Musculoskeletal: Normal range of motion.  Lymphadenopathy:    She has no cervical adenopathy.  Neurological: She is alert.  Skin: No rash noted. No erythema.    ED Course  Procedures (including critical care time)  Labs Reviewed  POCT URINALYSIS DIP (DEVICE) - Abnormal; Notable for the following:    Hgb urine dipstick LARGE (*)     Protein, ur >=300 (*)  Leukocytes, UA LARGE (*)  Biochemical Testing Only. Please order routine urinalysis from main lab if confirmatory testing is needed.   All other components within normal limits   No results found.   1. Urinary tract infection   2. Submandibular sialolithiasis       MDM   Uncomplicated urinary tract infection. With coexistent suspected sialolith adenitis.. Patient was prescribed Keflex. Describe symptoms that should warrant further evaluation.       Jimmie Molly,  MD 06/06/12 2101

## 2014-03-25 ENCOUNTER — Emergency Department (HOSPITAL_COMMUNITY)
Admission: EM | Admit: 2014-03-25 | Discharge: 2014-03-25 | Disposition: A | Discharge status: Home / Self Care | Payer: 59 | Source: Home / Self Care | Attending: Family Medicine | Admitting: Family Medicine

## 2014-03-25 ENCOUNTER — Encounter (HOSPITAL_COMMUNITY): Payer: Self-pay | Admitting: Emergency Medicine

## 2014-03-25 DIAGNOSIS — R350 Frequency of micturition: Secondary | ICD-10-CM

## 2014-03-25 DIAGNOSIS — R3915 Urgency of urination: Secondary | ICD-10-CM

## 2014-03-25 LAB — URINE MICROSCOPIC-ADD ON

## 2014-03-25 LAB — POCT URINALYSIS DIP (DEVICE)
BILIRUBIN URINE: NEGATIVE
GLUCOSE, UA: NEGATIVE mg/dL
Ketones, ur: NEGATIVE mg/dL
LEUKOCYTES UA: NEGATIVE
NITRITE: NEGATIVE
PH: 6 (ref 5.0–8.0)
Protein, ur: >=300 mg/dL — AB
Specific Gravity, Urine: >=1.03 (ref 1.005–1.030)
UROBILINOGEN UA: 0.2 mg/dL (ref 0.0–1.0)

## 2014-03-25 LAB — URINALYSIS, ROUTINE W REFLEX MICROSCOPIC
BILIRUBIN URINE: NEGATIVE
Glucose, UA: NEGATIVE mg/dL
Ketones, ur: NEGATIVE mg/dL
LEUKOCYTES UA: NEGATIVE
NITRITE: NEGATIVE
PH: 6 (ref 5.0–8.0)
Protein, ur: >300 mg/dL — AB
SPECIFIC GRAVITY, URINE: 1.028 (ref 1.005–1.030)
UROBILINOGEN UA: 0.2 mg/dL (ref 0.0–1.0)

## 2014-03-25 MED ORDER — CEFUROXIME AXETIL 250 MG PO TABS: 250.0000 mg | ORAL_TABLET | Freq: Two times a day (BID) | ORAL | Status: DC

## 2014-03-25 NOTE — Discharge Instructions (Signed)

## 2014-03-25 NOTE — ED Provider Notes (Signed)
CSN: [id number]     Arrival date & time 03/25/14  1808 History   First MD Initiated Contact with Patient 03/25/14 1830     Chief Complaint  Patient presents with  . Urinary Tract Infection   (Consider location/radiation/quality/duration/timing/severity/associated sxs/prior Treatment) HPI Comments: 38 year old female presents for evaluation of possible UTI. Since yesterday, she has urinary frequency and urgency. Her symptoms have been constant and is drinking water to help her symptoms but this has not helped her she denies any other associated symptoms. This is identical to her symptoms when she had UTIs in the past, but she denies history of UTIs. She denies polydipsia or foamy urine. No significant past medical history  Patient is a 38 y.o. female presenting with urinary tract infection.  Urinary Tract Infection    Past Medical History  Diagnosis Date  . Diverticulitis    Past Surgical History  Procedure Laterality Date  . Cholecystectomy    . Small intestine surgery    . Abdominal hysterectomy     History reviewed. No pertinent family history. History  Substance Use Topics  . Smoking status: Never Smoker   . Smokeless tobacco: Not on file  . Alcohol Use: No   OB History   Grav Para Term Preterm Abortions TAB SAB Ect Mult Living                 Review of Systems  Genitourinary: Positive for urgency and frequency.  All other systems reviewed and are negative.   Allergies  Iohexol  Home Medications   Prior to Admission medications   Medication Sig Start Date End Date Taking? Authorizing Provider  cefUROXime (CEFTIN) 250 MG tablet Take 1 tablet (250 mg total) by mouth 2 (two) times daily with a meal. 03/25/14   Graylon Good, PA-C  Naproxen Sodium (ALEVE PO) Take by mouth.      Historical Provider, MD   BP 156/89  Pulse 100  Temp(Src) 98.8 F (37.1 C) (Oral)  Resp 18  SpO2 100% Physical Exam  Nursing note and vitals reviewed. Constitutional: She is oriented  to person, place, and time. Vital signs are normal. She appears well-developed and well-nourished. No distress.  HENT:  Head: Normocephalic and atraumatic.  Neck: Normal range of motion. Neck supple. No JVD present.  Cardiovascular: Normal rate, regular rhythm and normal heart sounds.   Pulmonary/Chest: Effort normal and breath sounds normal. No respiratory distress.  Abdominal: Soft. Normal appearance and bowel sounds are normal. She exhibits no distension and no mass. There is no hepatosplenomegaly. There is no tenderness. There is no rebound, no guarding and no CVA tenderness.  Neurological: She is alert and oriented to person, place, and time. She has normal strength. Coordination normal.  Skin: Skin is warm and dry. No rash noted. She is not diaphoretic.  Psychiatric: She has a normal mood and affect. Judgment normal.    ED Course  Procedures (including critical care time) Labs Review Labs Reviewed  URINALYSIS, ROUTINE W REFLEX MICROSCOPIC - Abnormal; Notable for the following:    APPearance HAZY (*)    Hgb urine dipstick MODERATE (*)    Protein, ur >300 (*)    All other components within normal limits  URINE MICROSCOPIC-ADD ON - Abnormal; Notable for the following:    Squamous Epithelial / LPF MANY (*)    Bacteria, UA FEW (*)    Casts HYALINE CASTS (*)    All other components within normal limits  POCT URINALYSIS DIP (DEVICE) - Abnormal; Notable for  the following:    Hgb urine dipstick MODERATE (*)    Protein, ur >=300 (*)    All other components within normal limits  URINE CULTURE    Imaging Review No results found.   MDM   1. Urinary urgency   2. Urinary frequency    Urine dip inconclusive so sending UA with reflex microscopic, culture, and i-STAT  I-STAT will not cross over on EMR but was reviewed by me in the machine, everything is within normal limits  The formal urinalysis reveals moderate blood with greater than 300 proteins, still no leukocytes. He  microscopic add on showed many squamous epithelium with hyaline casts and few bacteria.  We'll treat for UTI, followup if no improvement in a few days   Meds ordered this encounter  Medications  . cefUROXime (CEFTIN) 250 MG tablet    Sig: Take 1 tablet (250 mg total) by mouth 2 (two) times daily with a meal.    Dispense:  10 tablet    Refill:  0    Order Specific Question:  Supervising Provider    Answer:  Clementeen GrahamOREY, EVAN, Kathie RhodesS [[id number]]     Graylon GoodZachary H Ina Poupard, PA-C 03/25/14 2040

## 2014-03-25 NOTE — ED Notes (Signed)
C/o uti States she has pain and urgency to urinate but only have a couple of drips No tx done

## 2014-03-27 LAB — POCT I-STAT, CHEM 8
BUN: 10 mg/dL (ref 6–23)
Calcium, Ion: 1.17 mmol/L (ref 1.12–1.23)
Chloride: 104 mEq/L (ref 96–112)
Creatinine, Ser: 1 mg/dL (ref 0.50–1.10)
Glucose, Bld: 97 mg/dL (ref 70–99)
HCT: 41 % (ref 36.0–46.0)
Hemoglobin: 13.9 g/dL (ref 12.0–15.0)
Potassium: 3.7 mEq/L (ref 3.7–5.3)
Sodium: 142 mEq/L (ref 137–147)
TCO2: 27 mmol/L (ref 0–100)

## 2014-03-28 LAB — URINE CULTURE: Colony Count: >=100000

## 2014-03-28 NOTE — ED Notes (Signed)
Urine culture: >100,000 colonies E. Coli.  Pt. adequately treated with Ceftin. Vassie MoselleYork, Shenell Rogalski M 03/28/2014

## 2014-03-31 NOTE — ED Provider Notes (Signed)
Medical screening examination/treatment/procedure(s) were performed by a resident physician or non-physician practitioner and as the supervising physician I was immediately available for consultation/collaboration.  Dujuan Stankowski, MD Family Medicine   Melvine Julin J Shalece Staffa, MD 03/31/14 2114 

## 2014-04-02 ENCOUNTER — Emergency Department (INDEPENDENT_AMBULATORY_CARE_PROVIDER_SITE_OTHER): Payer: 59

## 2014-04-02 ENCOUNTER — Emergency Department (HOSPITAL_COMMUNITY)
Admission: EM | Admit: 2014-04-02 | Discharge: 2014-04-02 | Disposition: A | Discharge status: Home / Self Care | Payer: 59 | Source: Home / Self Care | Attending: Emergency Medicine | Admitting: Emergency Medicine

## 2014-04-02 ENCOUNTER — Encounter (HOSPITAL_COMMUNITY): Payer: Self-pay | Admitting: Emergency Medicine

## 2014-04-02 DIAGNOSIS — N3000 Acute cystitis without hematuria: Secondary | ICD-10-CM

## 2014-04-02 DIAGNOSIS — N3001 Acute cystitis with hematuria: Secondary | ICD-10-CM

## 2014-04-02 LAB — POCT URINALYSIS DIP (DEVICE)
BILIRUBIN URINE: NEGATIVE
Glucose, UA: NEGATIVE mg/dL
Ketones, ur: NEGATIVE mg/dL
NITRITE: NEGATIVE
PH: 6 (ref 5.0–8.0)
PROTEIN: 100 mg/dL — AB
Specific Gravity, Urine: 1.015 (ref 1.005–1.030)
UROBILINOGEN UA: 0.2 mg/dL (ref 0.0–1.0)

## 2014-04-02 LAB — POCT PREGNANCY, URINE: Preg Test, Ur: NEGATIVE

## 2014-04-02 MED ORDER — LIDOCAINE HCL (PF) 1 % IJ SOLN
INTRAMUSCULAR | Status: AC
  Filled 2014-04-02: qty 5

## 2014-04-02 MED ORDER — CIPROFLOXACIN HCL 500 MG PO TABS: 500.0000 mg | ORAL_TABLET | Freq: Two times a day (BID) | ORAL | Status: DC

## 2014-04-02 MED ORDER — CEFTRIAXONE SODIUM 1 G IJ SOLR
1.0000 g | Freq: Once | INTRAMUSCULAR | Status: AC
  Administered 2014-04-02: 1 g via INTRAMUSCULAR

## 2014-04-02 MED ORDER — CEFTRIAXONE SODIUM 1 G IJ SOLR
INTRAMUSCULAR | Status: AC
  Filled 2014-04-02: qty 10

## 2014-04-02 MED ORDER — PHENAZOPYRIDINE HCL 200 MG PO TABS: 200.0000 mg | ORAL_TABLET | Freq: Three times a day (TID) | ORAL | Status: DC | PRN

## 2014-04-02 NOTE — ED Provider Notes (Signed)
Chief Complaint   Chief Complaint  Patient presents with  . Hematuria    History of Present Illness   [name] is is a 38 year old female who was seen here 8 days ago on August 11 with frequency, urgency, and hematuria. Her urinalysis was suspicious for UTI. A culture was done which grew out pan sensitive Escherichia coli. She also has i-STAT 8 which was normal. She was treated with Ceftin 250 mg twice a day. The patient states that she's not feeling much better. She still having the frequency, urgency, dysuria, and the blood in the urine seems worse now. She denies any other symptoms such as fever, chills, nausea, vomiting, back or abdominal pain. She denies any GYN symptoms. She has had urinary tract infections in the past.  Review of Systems   Other than as noted above, the patient denies any of the following symptoms: General:  No fevers or chills. GI:  No abdominal pain, back pain, nausea, or vomiting. GU:  No hematuria or incontinence. GYN:  No discharge, itching, vulvar pain or lesions, pelvic pain, or abnormal vaginal bleeding.  PMFSH   Past medical history, family history, social history, meds, and allergies were reviewed.    Physical Examination     Vital signs:  BP 133/96  Pulse 86  Temp(Src) 98.5 F (36.9 C) (Oral)  Resp 18  SpO2 99% Gen:  Alert, oriented, in no distress. Lungs:  Clear to auscultation, no wheezes, rales or rhonchi. Heart:  Regular rhythm, no gallop or murmer. Abdomen:  Flat and soft. There was slight suprapubic pain to palpation.  No guarding, or rebound.  No hepato-splenomegaly or mass.  Bowel sounds were normally active.  No hernia. Back:  No CVA tenderness.  Skin:  Clear, warm and dry.  Labs   Results for orders placed during the hospital encounter of 04/02/14  POCT URINALYSIS DIP (DEVICE)      Result Value Ref Range   Glucose, UA NEGATIVE  NEGATIVE mg/dL   Bilirubin Urine NEGATIVE  NEGATIVE   Ketones, ur NEGATIVE  NEGATIVE mg/dL   Specific Gravity, Urine 1.015  1.005 - 1.030   Hgb urine dipstick LARGE (*) NEGATIVE   pH 6.0  5.0 - 8.0   Protein, ur 100 (*) NEGATIVE mg/dL   Urobilinogen, UA 0.2  0.0 - 1.0 mg/dL   Nitrite NEGATIVE  NEGATIVE   Leukocytes, UA SMALL (*) NEGATIVE  POCT PREGNANCY, URINE      Result Value Ref Range   Preg Test, Ur NEGATIVE  NEGATIVE    A urine culture was obtained.  Results are pending at this time and we will call about any positive results.  Radiology   Dg Abd 1 View  04/02/2014   CLINICAL DATA:  Hematuria.  EXAM: ABDOMEN - 1 VIEW  COMPARISON:  MRI 12/02/2008. CT 11/29/2008.  FINDINGS: Surgical clips right upper quadrant. Soft tissues of the abdomen unremarkable. No pathologic abdominal calcification. Large amount of stool noted throughout the colon suggesting constipation. No bowel distention. No acute bony abnormality.  IMPRESSION: 1. No pathologic intra-abdominal calcifications. 2. Large amount of stool noted throughout the colon suggesting constipation. 3. Cholecystectomy.   Electronically Signed   By: Maisie Fus  Register   On: 04/02/2014 09:13   I reviewed the images independently and personally and concur with the radiologist's findings.  [facility]   Given Rocephin 1 g IM.  Assessment   The encounter diagnosis was Acute cystitis with hematuria.   No evidence of pyelonephritis.  Has persistent UTI symptoms and hematuria, despite being treated which should be on an appropriate antibiotic. Her KUB was not suspicious for kidney stone. I will reculture her urine, switched to Cipro, and she was given Rocephin 1 g IM. She was given a name of a primary care physician to followup with in about one week.  Plan   1.  Meds:  The following meds were prescribed:   Discharge Medication List as of 04/02/2014  9:25 AM    START taking these medications   Details  ciprofloxacin (CIPRO) 500 MG tablet Take 1 tablet (500 mg total) by mouth every 12 (twelve) hours., Starting  04/02/2014, Until Discontinued, Normal    phenazopyridine (PYRIDIUM) 200 MG tablet Take 1 tablet (200 mg total) by mouth 3 (three) times daily as needed for pain., Starting 04/02/2014, Until Discontinued, Normal        2.  Patient Education/Counseling:  The patient was given appropriate handouts, self care instructions, and instructed in symptomatic relief. The patient was told to avoid intercourse for 10 days, get extra fluids, and return for a follow up with her primary care doctor at the completion of treatment for a repeat UA and culture.    3.  Follow up:  The patient was told to follow up here if no better in 3 to 4 days, or sooner if becoming worse in any way, and given some red flag symptoms such as fever, persistent vomiting, or severe flank or abdominal pain which would prompt immediate return.     Reuben Likesavid C Athalene Kolle, MD 04/02/14 1026

## 2014-04-02 NOTE — ED Notes (Signed)
C/o persistent dysuria and urinary urgency; also had x1 episode of hematuria last night Seen here on 8/11; given ceftin... Still taking and tolerating well Denies f/v/n/d, abd/back pain Alert, no signs of acute distress.

## 2014-04-02 NOTE — Discharge Instructions (Signed)

## 2014-04-03 LAB — URINE CULTURE
COLONY COUNT: NO GROWTH
Culture: NO GROWTH

## 2014-12-21 ENCOUNTER — Emergency Department (HOSPITAL_COMMUNITY): Payer: BLUE CROSS/BLUE SHIELD

## 2014-12-21 ENCOUNTER — Emergency Department (HOSPITAL_COMMUNITY)
Admission: EM | Admit: 2014-12-21 | Discharge: 2014-12-21 | Disposition: A | Discharge status: Home / Self Care | Payer: BLUE CROSS/BLUE SHIELD | Attending: Emergency Medicine | Admitting: Emergency Medicine

## 2014-12-21 ENCOUNTER — Encounter (HOSPITAL_COMMUNITY): Payer: Self-pay | Admitting: *Deleted

## 2014-12-21 DIAGNOSIS — Z792 Long term (current) use of antibiotics: Secondary | ICD-10-CM | POA: Diagnosis not present

## 2014-12-21 DIAGNOSIS — Z8719 Personal history of other diseases of the digestive system: Secondary | ICD-10-CM | POA: Insufficient documentation

## 2014-12-21 DIAGNOSIS — M25562 Pain in left knee: Secondary | ICD-10-CM | POA: Insufficient documentation

## 2014-12-21 DIAGNOSIS — M6520 Calcific tendinitis, unspecified site: Secondary | ICD-10-CM | POA: Insufficient documentation

## 2014-12-21 MED ORDER — HYDROCODONE-ACETAMINOPHEN 5-325 MG PO TABS: 1.0000 | ORAL_TABLET | Freq: Four times a day (QID) | ORAL | Status: DC | PRN

## 2014-12-21 NOTE — ED Provider Notes (Signed)
CSN: [id number]     Arrival date & time 12/21/14  1952 History  This chart was scribed for non-physician practitioner Ivar Drapeob Zykera Abella, PA, working with Rolan BuccoMelanie Belfi, MD, by Tanda RockersMargaux Venter, ED Scribe. This patient was seen in room TR10C/TR10C and the patient's care was started at 8:14 PM.    Chief Complaint  Patient presents with  . Knee Pain   The history is provided by the patient. No language interpreter was used.     HPI Comments: [name] is a 39 y.o. female who presents to the Emergency Department complaining of gradual onset left knee pain that began Wednesday, 5/4 (4 days ago). Pt notes mild swelling to the knee. The pain is exacerbated with standing and walking. No past surgeries on the left knee. No known injury or trauma to the knee. Denies fever, chills, or any other symptoms.    Past Medical History  Diagnosis Date  . Diverticulitis    Past Surgical History  Procedure Laterality Date  . Cholecystectomy    . Small intestine surgery    . Abdominal hysterectomy     No family history on file. History  Substance Use Topics  . Smoking status: Never Smoker   . Smokeless tobacco: Not on file  . Alcohol Use: No   OB History    No data available     Review of Systems  Constitutional: Negative for fever and chills.  HENT: Negative for congestion and rhinorrhea.   Respiratory: Negative for cough and shortness of breath.   Cardiovascular: Negative for chest pain.  Gastrointestinal: Negative for nausea, vomiting and abdominal pain.  Musculoskeletal: Positive for joint swelling (Swelling to left knee. ), arthralgias (Left knee pain. ) and gait problem.  Neurological: Negative for weakness and numbness.  Psychiatric/Behavioral: Negative for confusion.      Allergies  Iohexol  Home Medications   Prior to Admission medications   Medication Sig Start Date End Date Taking? Authorizing Provider  cefUROXime (CEFTIN) 250 MG tablet Take 1 tablet (250 mg total) by mouth 2  (two) times daily with a meal. 03/25/14   Graylon GoodZachary H Baker, PA-C  ciprofloxacin (CIPRO) 500 MG tablet Take 1 tablet (500 mg total) by mouth every 12 (twelve) hours. 04/02/14   Reuben Likesavid C Keller, MD  HYDROcodone-acetaminophen (NORCO/VICODIN) 5-325 MG per tablet Take 1-2 tablets by mouth every 6 (six) hours as needed. 12/21/14   Roxy Horsemanobert Kannen Moxey, PA-C  Naproxen Sodium (ALEVE PO) Take by mouth.      Historical Provider, MD  phenazopyridine (PYRIDIUM) 200 MG tablet Take 1 tablet (200 mg total) by mouth 3 (three) times daily as needed for pain. 04/02/14   Reuben Likesavid C Keller, MD   Triage Vitals: BP 147/92 mmHg  Pulse 86  Temp(Src) 99 F (37.2 C)  Resp 18  Ht 5\' 11"  (1.803 m)  Wt 230 lb (104.327 kg)  BMI 32.09 kg/m2  SpO2 100%   Physical Exam  Constitutional: She is oriented to person, place, and time. She appears well-developed and well-nourished. No distress.  HENT:  Head: Normocephalic and atraumatic.  Eyes: Conjunctivae and EOM are normal.  Neck: Neck supple. No tracheal deviation present.  Cardiovascular: Normal rate.   Pulmonary/Chest: Effort normal. No respiratory distress.  Musculoskeletal: Normal range of motion.  Moderate tenderness to palpation over the lateral aspect of the knee, no bony abnormality or deformity, range of motion strength is normal, joint stability testing deferred secondary to body habitus  Neurological: She is alert and oriented to person, place, and  time.  Skin: Skin is warm and dry.  Psychiatric: She has a normal mood and affect. Her behavior is normal.  Nursing note and vitals reviewed.   ED Course  Procedures (including critical care time)  DIAGNOSTIC STUDIES: Oxygen Saturation is 100% on RA, normal by my interpretation.    COORDINATION OF CARE: 8:19 PM-Discussed treatment plan which includes DG L Knee with pt at bedside and pt agreed to plan.   Labs Review Labs Reviewed - No data to display  Imaging Review Dg Knee Complete 4 Views Left  12/21/2014   CLINICAL  DATA:  Left lateral knee pain for 3-4 days. Difficulty bearing weight.  EXAM: LEFT KNEE - COMPLETE 4+ VIEW  COMPARISON:  None.  FINDINGS: Mild articular space narrowing in the medial compartment with mild degenerative spurring of the tibial spine.  Along the lateral margin of the lateral femoral condyle, a 2.0 by 1.6 by 1.3 cm ossific structure with some additional rim like faint calcification adjacent to its margins is observed. This is further anterior and further lateral than I would expect for a fabella.  There is articular spurring along the patella. No significant knee effusion.  IMPRESSION: 1. The dominant finding is an ossified structure along the posterolateral knee, in the expected vicinity of the popliteus tendon, fibular collateral ligament, and potentially the anterior margin of the short head of the biceps femoris. The most likely cause is calcific tendinopathy of the popliteus tendon. Heterotopic ossification or free osteochondral fragment in this vicinity are also possibilities. The dominant ossific fragment is larger than the expected size of the popliteus tendon, but calcific tendinopathy can overgrow the tendon. CT or MRI would likely settled the differential diagnosis. 2. Mild medial compartmental articular space narrowing. 3. Articular spurring of the patella.   Electronically Signed   By: Gaylyn RongWalter  Liebkemann M.D.   On: 12/21/2014 21:18     EKG Interpretation None      MDM   Final diagnoses:  Knee pain, acute, left  Calcific tendinitis    Patient with left knee pain. There is some ossification along the posterior lateral knee. Likely calcific tendinitis. Will recommend orthopedic follow-up. Will give knee sleeve and pain medicine. Patient understands and agrees with the plan. She is stable and ready for discharge.     Roxy Horsemanobert Shareen Capwell, PA-C 12/21/14 [id number]  Rolan BuccoMelanie Belfi, MD 12/21/14 [phone]

## 2014-12-21 NOTE — Discharge Instructions (Signed)
Arthralgia °Your caregiver has diagnosed you as suffering from an arthralgia. Arthralgia means there is pain in a joint. This can come from many reasons including: °· Bruising the joint which causes soreness (inflammation) in the joint. °· Wear and tear on the joints which occur as we grow older (osteoarthritis). °· Overusing the joint. °· Various forms of arthritis. °· Infections of the joint. °Regardless of the cause of pain in your joint, most of these different pains respond to anti-inflammatory drugs and rest. The exception to this is when a joint is infected, and these cases are treated with antibiotics, if it is a bacterial infection. °HOME CARE INSTRUCTIONS  °· Rest the injured area for as long as directed by your caregiver. Then slowly start using the joint as directed by your caregiver and as the pain allows. Crutches as directed may be useful if the ankles, knees or hips are involved. If the knee was splinted or casted, continue use and care as directed. If an stretchy or elastic wrapping bandage has been applied today, it should be removed and re-applied every 3 to 4 hours. It should not be applied tightly, but firmly enough to keep swelling down. Watch toes and feet for swelling, bluish discoloration, coldness, numbness or excessive pain. If any of these problems (symptoms) occur, remove the ace bandage and re-apply more loosely. If these symptoms persist, contact your caregiver or return to this location. °· For the first 24 hours, keep the injured extremity elevated on pillows while lying down. °· Apply ice for 15-20 minutes to the sore joint every couple hours while awake for the first half day. Then 03-04 times per day for the first 48 hours. Put the ice in a plastic bag and place a towel between the bag of ice and your skin. °· Wear any splinting, casting, elastic bandage applications, or slings as instructed. °· Only take over-the-counter or prescription medicines for pain, discomfort, or fever as  directed by your caregiver. Do not use aspirin immediately after the injury unless instructed by your physician. Aspirin can cause increased bleeding and bruising of the tissues. °· If you were given crutches, continue to use them as instructed and do not resume weight bearing on the sore joint until instructed. °Persistent pain and inability to use the sore joint as directed for more than 2 to 3 days are warning signs indicating that you should see a caregiver for a follow-up visit as soon as possible. Initially, a hairline fracture (break in bone) may not be evident on X-rays. Persistent pain and swelling indicate that further evaluation, non-weight bearing or use of the joint (use of crutches or slings as instructed), or further X-rays are indicated. X-rays may sometimes not show a small fracture until a week or 10 days later. Make a follow-up appointment with your own caregiver or one to whom we have referred you. A radiologist (specialist in reading X-rays) may read your X-rays. Make sure you know how you are to obtain your X-ray results. Do not assume everything is normal if you do not hear from us. °SEEK MEDICAL CARE IF: °Bruising, swelling, or pain increases. °SEEK IMMEDIATE MEDICAL CARE IF:  °· Your fingers or toes are numb or blue. °· The pain is not responding to medications and continues to stay the same or get worse. °· The pain in your joint becomes severe. °· You develop a fever over 102° F (38.9° C). °· It becomes impossible to move or use the joint. °MAKE SURE YOU:  °·   Understand these instructions.  Will watch your condition.  Will get help right away if you are not doing well or get worse. Document Released: 08/01/2005 Document Revised: 10/24/2011 Document Reviewed: 03/19/2008 [facility]Exit[facility] Patient Information 2015 [place]ExitCare, [place]. This information is not intended to replace advice given to you by your health care provider. Make sure you discuss any questions you have with your health care  provider. Calcific Tendinitis Calcific tendinitis occurs when crystals of calcium are deposited in a tendon. Tendons are bands of strong, fibrous tissue that attach muscles to bones. Tendons are an important part of joints. They make the joint move and they absorb some of the stress that a joint receives during use. When calcium is deposited in the tendon, the tendon becomes stiff, painful, and it can become swollen. Calcific tendinitis occurs frequently in the shoulder joint, in a structure called the rotator cuff. CAUSES  The cause of calcific tendinitis is unclear. It may be associated with:  Overuse of the tendon, such as from repetitive motion.  Excess stress on the tendon.  Aging.  Repetitive, mild injuries. SYMPTOMS   Pain may or may not be present. If it is present, it may occur when moving the joint.  Tenderness when pressure is applied to the tendon.  A snapping or popping sound when the joint moves.  Decreased motion of the joint.  Difficulty sleeping due to pain in the joint. DIAGNOSIS  Your health care provider will perform a physical exam. Imaging tests may also be used to make the diagnosis. These may include X-rays, an MRI, or a CT scan. TREATMENT  Generally, calcific tendinitis resolves on its own. Treatment for pain of calcific tendinitis may include:  Taking over-the-counter medicines, such as anti-inflammatory drugs.  Applying ice packs to the joint.  Following a specific exercise program to keep the joint working properly.  Attending physical therapy sessions.  Avoiding activities that cause pain. Treatment for more severe calcific tendinitis may require:  Injecting cortisone steroids or pain relieving medicines into or around the joint.  Manipulating the joint after you are given medicine to numb the area (local anesthetic).  Inflating the joint with sterile fluid to increase the flexibility of the tendons.  Shockwave therapy, which involves  focusing sound waves on the joint. If other treatments do not work, surgery may be done to clean out the calcium deposits and repair the tendons where needed. Most people do not need surgery. HOME CARE INSTRUCTIONS   Only take over-the-counter or prescription medicines for pain, fever, or discomfort as directed by your health care provider.  Follow your health care provider's recommendations for activity and exercise. SEEK MEDICAL CARE IF:  You notice an increase in pain or numbness.  You develop new weakness.  You notice increased joint stiffness or a sensation of looseness in the joint.  You notice increasing redness, swelling, or warmth around the joint area. SEEK IMMEDIATE MEDICAL CARE IF:  You have a fever or persistent symptoms for more than 2 to 3 days.  You have a fever and your symptoms suddenly get worse. MAKE SURE YOU:  Understand these instructions.  Will watch your condition.  Will get help right away if you are not doing well or get worse. Document Released: 05/10/2008 Document Revised: 12/16/2013 Document Reviewed: 11/10/2011 [facility] CenterExitCare Patient Information 2015 [place]ExitCare, [place]LLC. This information is not intended to replace advice given to you by your health care provider. Make sure you discuss any questions you have with your health care provider. Knee Pain  The knee is the complex joint between your thigh and your lower leg. It is made up of bones, tendons, ligaments, and cartilage. The bones that make up the knee are:  The femur in the thigh.  The tibia and fibula in the lower leg.  The patella or kneecap riding in the groove on the lower femur. CAUSES  Knee pain is a common complaint with many causes. A few of these causes are:  Injury, such as:  A ruptured ligament or tendon injury.  Torn cartilage.  Medical conditions, such as:  Gout  Arthritis  Infections  Overuse, over training, or overdoing a physical activity. Knee pain can be minor or  severe. Knee pain can accompany debilitating injury. Minor knee problems often respond well to self-care measures or get well on their own. More serious injuries may need medical intervention or even surgery. SYMPTOMS The knee is complex. Symptoms of knee problems can vary widely. Some of the problems are:  Pain with movement and weight bearing.  Swelling and tenderness.  Buckling of the knee.  Inability to straighten or extend your knee.  Your knee locks and you cannot straighten it.  Warmth and redness with pain and fever.  Deformity or dislocation of the kneecap. DIAGNOSIS  Determining what is wrong may be very straight forward such as when there is an injury. It can also be challenging because of the complexity of the knee. Tests to make a diagnosis may include:  Your caregiver taking a history and doing a physical exam.  Routine X-rays can be used to rule out other problems. X-rays will not reveal a cartilage tear. Some injuries of the knee can be diagnosed by:  Arthroscopy a surgical technique by which a small video camera is inserted through tiny incisions on the sides of the knee. This procedure is used to examine and repair internal knee joint problems. Tiny instruments can be used during arthroscopy to repair the torn knee cartilage (meniscus).  Arthrography is a radiology technique. A contrast liquid is directly injected into the knee joint. Internal structures of the knee joint then become visible on X-ray film.  An MRI scan is a non X-ray radiology procedure in which magnetic fields and a computer produce two- or three-dimensional images of the inside of the knee. Cartilage tears are often visible using an MRI scanner. MRI scans have largely replaced arthrography in diagnosing cartilage tears of the knee.  Blood work.  Examination of the fluid that helps to lubricate the knee joint (synovial fluid). This is done by taking a sample out using a needle and a  syringe. TREATMENT The treatment of knee problems depends on the cause. Some of these treatments are:  Depending on the injury, proper casting, splinting, surgery, or physical therapy care will be needed.  Give yourself adequate recovery time. Do not overuse your joints. If you begin to get sore during workout routines, back off. Slow down or do fewer repetitions.  For repetitive activities such as cycling or running, maintain your strength and nutrition.  Alternate muscle groups. For example, if you are a weight lifter, work the upper body on one day and the lower body the next.  Either tight or weak muscles do not give the proper support for your knee. Tight or weak muscles do not absorb the stress placed on the knee joint. Keep the muscles surrounding the knee strong.  Take care of mechanical problems.  If you have flat feet, orthotics or special shoes may help. See  your caregiver if you need help.  Arch supports, sometimes with wedges on the inner or outer aspect of the heel, can help. These can shift pressure away from the side of the knee most bothered by osteoarthritis.  A brace called an "unloader" brace also may be used to help ease the pressure on the most arthritic side of the knee.  If your caregiver has prescribed crutches, braces, wraps or ice, use as directed. The acronym for this is PRICE. This means protection, rest, ice, compression, and elevation.  Nonsteroidal anti-inflammatory drugs (NSAIDs), can help relieve pain. But if taken immediately after an injury, they may actually increase swelling. Take NSAIDs with food in your stomach. Stop them if you develop stomach problems. Do not take these if you have a history of ulcers, stomach pain, or bleeding from the bowel. Do not take without your caregiver's approval if you have problems with fluid retention, heart failure, or kidney problems.  For ongoing knee problems, physical therapy may be helpful.  Glucosamine and  chondroitin are over-the-counter dietary supplements. Both may help relieve the pain of osteoarthritis in the knee. These medicines are different from the usual anti-inflammatory drugs. Glucosamine may decrease the rate of cartilage destruction.  Injections of a corticosteroid drug into your knee joint may help reduce the symptoms of an arthritis flare-up. They may provide pain relief that lasts a few months. You may have to wait a few months between injections. The injections do have a small increased risk of infection, water retention, and elevated blood sugar levels.  Hyaluronic acid injected into damaged joints may ease pain and provide lubrication. These injections may work by reducing inflammation. A series of shots may give relief for as long as 6 months.  Topical painkillers. Applying certain ointments to your skin may help relieve the pain and stiffness of osteoarthritis. Ask your pharmacist for suggestions. Many over the-counter products are approved for temporary relief of arthritis pain.  In some countries, doctors often prescribe topical NSAIDs for relief of chronic conditions such as arthritis and tendinitis. A review of treatment with NSAID creams found that they worked as well as oral medications but without the serious side effects. PREVENTION  Maintain a healthy weight. Extra pounds put more strain on your joints.  Get strong, stay limber. Weak muscles are a common cause of knee injuries. Stretching is important. Include flexibility exercises in your workouts.  Be smart about exercise. If you have osteoarthritis, chronic knee pain or recurring injuries, you may need to change the way you exercise. This does not mean you have to stop being active. If your knees ache after jogging or playing basketball, consider switching to swimming, water aerobics, or other low-impact activities, at least for a few days a week. Sometimes limiting high-impact activities will provide relief.  Make  sure your shoes fit well. Choose footwear that is right for your sport.  Protect your knees. Use the proper gear for knee-sensitive activities. Use kneepads when playing volleyball or laying carpet. Buckle your seat belt every time you drive. Most shattered kneecaps occur in car accidents.  Rest when you are tired. SEEK MEDICAL CARE IF:  You have knee pain that is continual and does not seem to be getting better.  SEEK IMMEDIATE MEDICAL CARE IF:  Your knee joint feels hot to the touch and you have a high fever. MAKE SURE YOU:   Understand these instructions.  Will watch your condition.  Will get help right away if you are not doing  well or get worse. Document Released: 05/29/2007 Document Revised: 10/24/2011 Document Reviewed: 05/29/2007 [facility] CenterExit[facility] Patient Information 2015 [place]ExitCare, [place]LLC. This information is not intended to replace advice given to you by your health care provider. Make sure you discuss any questions you have with your health care provider.

## 2014-12-21 NOTE — ED Notes (Signed)
Pt c/o L lateral knee pain since Wednesday; denies injury to knee. Pain worsens when standing. No swelling noted

## 2014-12-21 NOTE — ED Notes (Signed)
The pt is c/o lt knee pain since Wednesday  No known injury.  lmp none

## 2015-09-18 ENCOUNTER — Other Ambulatory Visit: Payer: Self-pay | Admitting: Internal Medicine

## 2015-09-18 DIAGNOSIS — M5412 Radiculopathy, cervical region: Secondary | ICD-10-CM

## 2015-09-25 ENCOUNTER — Ambulatory Visit
Admission: RE | Admit: 2015-09-25 | Discharge: 2015-09-25 | Disposition: A | Discharge status: Home / Self Care | Payer: Managed Care, Other (non HMO) | Source: Ambulatory Visit | Attending: Internal Medicine | Admitting: Internal Medicine

## 2015-09-25 DIAGNOSIS — M5412 Radiculopathy, cervical region: Secondary | ICD-10-CM

## 2017-06-25 ENCOUNTER — Emergency Department (HOSPITAL_COMMUNITY): Payer: Managed Care, Other (non HMO)

## 2017-06-25 ENCOUNTER — Emergency Department (HOSPITAL_COMMUNITY)
Admission: EM | Admit: 2017-06-25 | Discharge: 2017-06-25 | Disposition: A | Discharge status: Home / Self Care | Payer: Managed Care, Other (non HMO) | Attending: Emergency Medicine | Admitting: Emergency Medicine

## 2017-06-25 ENCOUNTER — Encounter (HOSPITAL_COMMUNITY): Payer: Self-pay

## 2017-06-25 DIAGNOSIS — Z8719 Personal history of other diseases of the digestive system: Secondary | ICD-10-CM | POA: Diagnosis not present

## 2017-06-25 DIAGNOSIS — R1012 Left upper quadrant pain: Secondary | ICD-10-CM | POA: Diagnosis not present

## 2017-06-25 LAB — COMPREHENSIVE METABOLIC PANEL
ALBUMIN: 3.8 g/dL (ref 3.5–5.0)
ALT: 18 U/L (ref 14–54)
AST: 33 U/L (ref 15–41)
Alkaline Phosphatase: 27 U/L — ABNORMAL LOW (ref 38–126)
Anion gap: 7 (ref 5–15)
BILIRUBIN TOTAL: 0.8 mg/dL (ref 0.3–1.2)
BUN: 9 mg/dL (ref 6–20)
CO2: 23 mmol/L (ref 22–32)
Calcium: 9 mg/dL (ref 8.9–10.3)
Chloride: 106 mmol/L (ref 101–111)
Creatinine, Ser: 1.03 mg/dL — ABNORMAL HIGH (ref 0.44–1.00)
GFR calc Af Amer: >60 mL/min (ref >60)
GFR calc non Af Amer: >60 mL/min (ref >60)
GLUCOSE: 87 mg/dL (ref 65–99)
Potassium: 3.9 mmol/L (ref 3.5–5.1)
Sodium: 136 mmol/L (ref 135–145)
TOTAL PROTEIN: 8.9 g/dL — AB (ref 6.5–8.1)

## 2017-06-25 LAB — CBC
HCT: 35.5 % — ABNORMAL LOW (ref 36.0–46.0)
Hemoglobin: 12.1 g/dL (ref 12.0–15.0)
MCH: 31.9 pg (ref 26.0–34.0)
MCHC: 34.1 g/dL (ref 30.0–36.0)
MCV: 93.7 fL (ref 78.0–100.0)
Platelets: 288 10*3/uL (ref 150–400)
RBC: 3.79 MIL/uL — ABNORMAL LOW (ref 3.87–5.11)
RDW: 13.1 % (ref 11.5–15.5)
WBC: 5.1 10*3/uL (ref 4.0–10.5)

## 2017-06-25 LAB — URINALYSIS, ROUTINE W REFLEX MICROSCOPIC
BILIRUBIN URINE: NEGATIVE
Glucose, UA: NEGATIVE mg/dL
Ketones, ur: NEGATIVE mg/dL
Leukocytes, UA: NEGATIVE
NITRITE: NEGATIVE
PROTEIN: 100 mg/dL — AB
Specific Gravity, Urine: 1.02 (ref 1.005–1.030)
pH: 5 (ref 5.0–8.0)

## 2017-06-25 LAB — LIPASE, BLOOD: Lipase: 26 U/L (ref 11–51)

## 2017-06-25 LAB — PREGNANCY, URINE: Preg Test, Ur: NEGATIVE

## 2017-06-25 MED ORDER — MORPHINE SULFATE (PF) 4 MG/ML IV SOLN
2.0000 mg | Freq: Once | INTRAVENOUS | Status: AC
  Administered 2017-06-25: 2 mg via INTRAVENOUS
  Filled 2017-06-25: qty 1

## 2017-06-25 MED ORDER — GI COCKTAIL ~~LOC~~
30.0000 mL | Freq: Once | ORAL | Status: AC
  Administered 2017-06-25: 30 mL via ORAL
  Filled 2017-06-25: qty 30

## 2017-06-25 MED ORDER — ONDANSETRON HCL 4 MG/2ML IJ SOLN
4.0000 mg | Freq: Once | INTRAMUSCULAR | Status: AC
  Administered 2017-06-25: 4 mg via INTRAVENOUS
  Filled 2017-06-25: qty 2

## 2017-06-25 NOTE — ED Notes (Signed)
Patient transported to CT 

## 2017-06-25 NOTE — ED Triage Notes (Signed)
Patient complains of left upper abdominal pain since early yesterday am, no nausea, no vomiting, reports normal bowel habits. States that she has diverticulitis and thinks related

## 2017-06-25 NOTE — ED Provider Notes (Signed)
[facility] EMERGENCY DEPARTMENT Provider Note   CSN: [id number] Arrival date & time: 06/25/17  1416     History   Chief Complaint No chief complaint on file.   HPI [name] is a 41 y.o. female who presents today with chief complaint acute onset, intermittent left upper quadrant abdominal pain which began yesterday morning.  Patient states that the pain awoke her at around 2 or 3 AM yesterday.  Pain is primarily in the left upper quadrant with radiation to the epigastric region.  Pain is intermittent but when it occurs it is severe and sharp.  No aggravating or alleviating factors noted.  It is not related to mealtimes.  Denies fevers, chills, nausea, vomiting, melena, hematochezia, diarrhea, or constipation.  She denies urinary symptoms, vaginal pain, itching, or bleeding.  She has tried ibuprofen and ginger ale for her some.    Last oral intake prior to symptom onset was Friday evening in which she ate a chicken pesto flat bread and smoothie fro tropical smoothie.  She states that her sister gave her a sinus infection, and her sister gave her 4 tablets of Bactrim last of which she took Friday afternoon.  She has not formally prescribed antibiotics for her sinus congestion.  She states the symptoms are improving.   Of note, patient states her symptoms feel almost identical to the last time she had diverticulitis several years ago.  She states that she did not have nausea, vomiting, diarrhea, or fevers at that time and pain was localized to the left upper quadrant.  She states that at that time she was hospitalized, became septic, and required exploratory laparotomy which showed diverticulitis and resulted in a unilateral oophorectomy.  Her sister, mother, and grandmother also have a history of diverticulitis.  She does not have a primary care physician at this time.  The history is provided by the patient.    Past Medical History:  Diagnosis Date  . Diverticulitis       There are no active problems to display for this patient.   Past Surgical History:  Procedure Laterality Date  . ABDOMINAL HYSTERECTOMY    . CHOLECYSTECTOMY    . SMALL INTESTINE SURGERY      OB History    No data available       Home Medications    Prior to Admission medications   Medication Sig Start Date End Date Taking? Authorizing Provider  aspirin-acetaminophen-caffeine (EXCEDRIN MIGRAINE) [phone] MG tablet Take 2 tablets every 6 (six) hours as needed by mouth for headache or migraine.   Yes [provider]    Family History No family history on file.  Social History Social History   Tobacco Use  . Smoking status: Never Smoker  . Smokeless tobacco: Never Used  Substance Use Topics  . Alcohol use: No  . Drug use: No     Allergies   Iohexol   Review of Systems Review of Systems  Constitutional: Negative for chills and fever.  Respiratory: Negative for shortness of breath.   Cardiovascular: Negative for chest pain.  Gastrointestinal: Positive for abdominal pain. Negative for blood in stool, constipation, diarrhea, nausea and vomiting.  Genitourinary: Negative for decreased urine volume, dysuria, frequency, hematuria, urgency, vaginal bleeding, vaginal discharge and vaginal pain.  All other systems reviewed and are negative.    Physical Exam Updated Vital Signs BP (!) 153/96 (BP Location: Right Arm)   Pulse 85   Temp 98.9 F (37.2 C) (Oral)   Resp  16   Ht 5\' 11"  (1.803 m)   Wt 77.1 kg (170 lb)   SpO2 100%   BMI 23.71 kg/m   Physical Exam  Constitutional: She appears well-developed and well-nourished. No distress.  HENT:  Head: Normocephalic and atraumatic.  Eyes: Conjunctivae are normal. Right eye exhibits no discharge. Left eye exhibits no discharge.  Neck: Normal range of motion. Neck supple. No JVD present. No tracheal deviation present.  Cardiovascular: Normal rate, regular rhythm and normal heart sounds.   Pulmonary/Chest: Effort normal and breath sounds normal.  Abdominal: Soft. Bowel sounds are normal. She exhibits no distension. There is tenderness. There is no guarding.  LUQ and epigastric pain on palpation, Murphy sign absent, Rovsing's absent, no CVA tenderness, no McBurney's point tenderness.  Well-healed midline surgical incision.  Musculoskeletal: She exhibits no edema.  Neurological: She is alert.  Skin: Skin is warm and dry. No erythema.  Psychiatric: She has a normal mood and affect. Her behavior is normal.  Nursing note and vitals reviewed.    ED Treatments / Results  Labs (all labs ordered are listed, but only abnormal results are displayed) Labs Reviewed  COMPREHENSIVE METABOLIC PANEL - Abnormal; Notable for the following components:      Result Value   Creatinine, Ser 1.03 (*)    Total Protein 8.9 (*)    Alkaline Phosphatase 27 (*)    All other components within normal limits  CBC - Abnormal; Notable for the following components:   RBC 3.79 (*)    HCT 35.5 (*)    All other components within normal limits  URINALYSIS, ROUTINE W REFLEX MICROSCOPIC - Abnormal; Notable for the following components:   APPearance HAZY (*)    Hgb urine dipstick SMALL (*)    Protein, ur 100 (*)    Bacteria, UA RARE (*)    Squamous Epithelial / LPF 6-30 (*)    All other components within normal limits  LIPASE, BLOOD  PREGNANCY, URINE    EKG  EKG Interpretation None       Radiology Ct Abdomen Pelvis Wo Contrast  Result Date: 06/25/2017 CLINICAL DATA:  41 y/o  F; left upper abdominal pain. EXAM: CT ABDOMEN AND PELVIS WITHOUT CONTRAST TECHNIQUE: Multidetector CT imaging of the abdomen and pelvis was performed following the standard protocol without IV contrast. COMPARISON:  12/02/2008 pelvic MRI. 11/29/2008 CT abdomen and pelvis. FINDINGS: Lower chest: No acute abnormality. Hepatobiliary: No focal liver abnormality is seen. Status post cholecystectomy. No biliary dilatation.  Pancreas: Unremarkable. No pancreatic ductal dilatation or surrounding inflammatory changes. Spleen: Normal in size without focal abnormality. Adrenals/Urinary Tract: Adrenal glands are unremarkable. Kidneys are normal, without renal calculi, focal lesion, or hydronephrosis. Bladder is unremarkable. Stomach/Bowel: Stomach is within normal limits. Appendix not identified, no pericecal inflammation. No evidence of bowel wall thickening, distention, or inflammatory changes. Sigmoid colocolonic anastomosis in left upper quadrant without proximal obstruction. Vascular/Lymphatic: No significant vascular findings are present. No enlarged abdominal or pelvic lymph nodes. Reproductive: Status post hysterectomy. No adnexal masses. Other: No abdominal wall hernia or abnormality. No abdominopelvic ascites. Musculoskeletal: No acute or significant osseous findings. IMPRESSION: 1. No acute process identified. 2. Left upper quadrant bowel anastomosis without obstructive or inflammatory change. 3. Cholecystectomy and hysterectomy. Electronically Signed   By: Mitzi HansenLance  Furusawa-Stratton M.D.   On: 06/25/2017 20:18    Procedures Procedures (including critical care time)  Medications Ordered in ED Medications  gi cocktail (Maalox,Lidocaine,Donnatal) (30 mLs Oral Given 06/25/17 1655)  morphine 4 MG/ML injection 2 mg (2  mg Intravenous Given 06/25/17 1731)  ondansetron (ZOFRAN) injection 4 mg (4 mg Intravenous Given 06/25/17 1731)     Initial Impression / Assessment and Plan / ED Course  I have reviewed the triage vital signs and the nursing notes.  Pertinent labs & imaging results that were available during my care of the patient were reviewed by me and considered in my medical decision making (see chart for details).      Patient with intermittent left upper quadrant abdominal pain which she states is consistent with prior episode of diverticulitis.  Afebrile, slightly hypertensive initially with improvement while in  the ED.  She is nontoxic in appearance lab work is reassuring with no leukocytosis, no significant Letcher light abnormalities, and UA is not consistent with UTI or nephrolithiasis.  CT scan of the abdomen reviewed by me shows no acute process, but does show Left upper quadrant bowel anastomosis without obstructive or inflammatory change as well as cholecystectomy and hysterectomy.  Doubt bowel obstruction, perforation, intra-abdominal infection, appendicitis, or other acute surgical abdominal pathology.  Pain managed while in the ED and on reevaluation patient is resting comfortably, repeat abdominal examination is unremarkable.  She stable for discharge home with follow-up with GI.  Discussed strict indications for return to the ED. Pt and patient's sister verbalized understanding of and agreement with plan and patient is safe for discharge home at this time.   Final Clinical Impressions(s) / ED Diagnoses   Final diagnoses:  Left upper quadrant pain    ED Discharge Orders    None       Bennye AlmFawze, Ferd Horrigan A, PA-C 06/25/17 2049    Gerhard MunchLockwood, Robert, MD 06/25/17 [phone]

## 2017-06-25 NOTE — Discharge Instructions (Signed)
Alternate 600 mg of ibuprofen and [phone] mg of Tylenol every 3 hours as needed for pain. Do not exceed 4000 mg of Tylenol daily.  Drink plenty of fluids and get plenty of rest.  Eat a diet of bland foods that will not upset the stomach.  Return to the ED immediately for any concerning signs or symptoms develop such as fever, vomiting, blood in the urine or stool, or constipation.  Follow-up with gastroenterology for reevaluation of your symptoms.

## 2017-06-25 NOTE — ED Notes (Addendum)
ED Provider at bedside. PA, [employer]

## 2017-06-25 NOTE — ED Notes (Signed)
Called lab reports urine in process now.

## 2017-07-19 ENCOUNTER — Other Ambulatory Visit: Payer: Self-pay

## 2017-07-19 ENCOUNTER — Emergency Department (HOSPITAL_COMMUNITY)
Admission: EM | Admit: 2017-07-19 | Discharge: 2017-07-20 | Disposition: A | Discharge status: Home / Self Care | Payer: Managed Care, Other (non HMO) | Attending: Emergency Medicine | Admitting: Emergency Medicine

## 2017-07-19 ENCOUNTER — Encounter (HOSPITAL_COMMUNITY): Payer: Self-pay | Admitting: Emergency Medicine

## 2017-07-19 ENCOUNTER — Emergency Department (HOSPITAL_COMMUNITY): Payer: Managed Care, Other (non HMO)

## 2017-07-19 DIAGNOSIS — R6889 Other general symptoms and signs: Secondary | ICD-10-CM | POA: Diagnosis present

## 2017-07-19 DIAGNOSIS — R51 Headache: Secondary | ICD-10-CM | POA: Diagnosis not present

## 2017-07-19 DIAGNOSIS — R000 Tachycardia, unspecified: Secondary | ICD-10-CM | POA: Diagnosis not present

## 2017-07-19 DIAGNOSIS — R0602 Shortness of breath: Secondary | ICD-10-CM | POA: Diagnosis not present

## 2017-07-19 DIAGNOSIS — J181 Lobar pneumonia, unspecified organism: Secondary | ICD-10-CM | POA: Diagnosis not present

## 2017-07-19 DIAGNOSIS — J189 Pneumonia, unspecified organism: Secondary | ICD-10-CM

## 2017-07-19 DIAGNOSIS — I10 Essential (primary) hypertension: Secondary | ICD-10-CM | POA: Insufficient documentation

## 2017-07-19 DIAGNOSIS — R05 Cough: Secondary | ICD-10-CM | POA: Insufficient documentation

## 2017-07-19 DIAGNOSIS — M7918 Myalgia, other site: Secondary | ICD-10-CM | POA: Diagnosis not present

## 2017-07-19 LAB — CBC WITH DIFFERENTIAL/PLATELET
BASOS ABS: 0 10*3/uL (ref 0.0–0.1)
Basophils Relative: 0 %
EOS PCT: 1 %
Eosinophils Absolute: 0.1 10*3/uL (ref 0.0–0.7)
HEMATOCRIT: 33.3 % — AB (ref 36.0–46.0)
Hemoglobin: 11.3 g/dL — ABNORMAL LOW (ref 12.0–15.0)
LYMPHS ABS: 3.3 10*3/uL (ref 0.7–4.0)
LYMPHS PCT: 33 %
MCH: 31.5 pg (ref 26.0–34.0)
MCHC: 33.9 g/dL (ref 30.0–36.0)
MCV: 92.8 fL (ref 78.0–100.0)
MONO ABS: 0.9 10*3/uL (ref 0.1–1.0)
Monocytes Relative: 9 %
NEUTROS ABS: 5.5 10*3/uL (ref 1.7–7.7)
Neutrophils Relative %: 57 %
PLATELETS: 308 10*3/uL (ref 150–400)
RBC: 3.59 MIL/uL — ABNORMAL LOW (ref 3.87–5.11)
RDW: 12.8 % (ref 11.5–15.5)
WBC: 9.8 10*3/uL (ref 4.0–10.5)

## 2017-07-19 LAB — PROTIME-INR
INR: 1.07
Prothrombin Time: 13.9 seconds (ref 11.4–15.2)

## 2017-07-19 LAB — INFLUENZA PANEL BY PCR (TYPE A & B)
Influenza A By PCR: NEGATIVE
Influenza B By PCR: NEGATIVE

## 2017-07-19 LAB — I-STAT TROPONIN, ED: TROPONIN I, POC: 0 ng/mL (ref 0.00–0.08)

## 2017-07-19 MED ORDER — METOCLOPRAMIDE HCL 5 MG/ML IJ SOLN
10.0000 mg | Freq: Once | INTRAMUSCULAR | Status: AC
  Administered 2017-07-19: 10 mg via INTRAVENOUS
  Filled 2017-07-19: qty 2

## 2017-07-19 MED ORDER — SODIUM CHLORIDE 0.9 % IV SOLN
Freq: Once | INTRAVENOUS | Status: AC
  Administered 2017-07-19: 23:00:00 via INTRAVENOUS

## 2017-07-19 MED ORDER — DIPHENHYDRAMINE HCL 50 MG/ML IJ SOLN
25.0000 mg | Freq: Once | INTRAMUSCULAR | Status: AC
  Administered 2017-07-19: 25 mg via INTRAVENOUS
  Filled 2017-07-19: qty 1

## 2017-07-19 MED ORDER — CLONIDINE HCL 0.1 MG PO TABS
0.1000 mg | ORAL_TABLET | Freq: Once | ORAL | Status: AC
  Administered 2017-07-19: 0.1 mg via ORAL
  Filled 2017-07-19: qty 1

## 2017-07-19 NOTE — ED Provider Notes (Signed)
[facility] EMERGENCY DEPARTMENT Provider Note   CSN: [id number] Arrival date & time: 07/19/17  1445     History   Chief Complaint Chief Complaint  Patient presents with  . Influenza  . Hypertension    HPI [name] is a 41 y.o. female.  HPI Patient when she started with feeling both achy and hot and cold at the same time yesterday. She reports diffuse muscle aches. She reports she has had cough which has been nonproductive. She has felt short of breath. No lower extremity swelling or calf pain. Patient reports some generalized aching frontal headache. No visual changes. Patient denies prior history of hypertension. She reports she last checked pressures sometime in the summer. She pushes never been recommended take blood pressure medications. She has a positive family history of hypertension with sister and parents hypertensive. No tobacco alcohol or drug use. Past Medical History:  Diagnosis Date  . Diverticulitis     There are no active problems to display for this patient.   Past Surgical History:  Procedure Laterality Date  . ABDOMINAL HYSTERECTOMY    . CHOLECYSTECTOMY    . SMALL INTESTINE SURGERY      OB History    No data available       Home Medications    Prior to Admission medications   Medication Sig Start Date End Date Taking? Authorizing Provider  aspirin-acetaminophen-caffeine (EXCEDRIN MIGRAINE) [phone] MG tablet Take 2 tablets every 6 (six) hours as needed by mouth for headache or migraine.   Yes [provider]  ibuprofen (ADVIL,MOTRIN) 200 MG tablet Take 600 mg by mouth every 6 (six) hours as needed for fever, headache or moderate pain.   Yes [provider]  Pseudoephedrine-Ibuprofen (ADVIL COLD & SINUS LIQUI-GELS PO) Take 1 tablet by mouth every 6 (six) hours as needed.   Yes [provider]    Family History No family history on file.  Social History Social History   Tobacco Use  .  Smoking status: Never Smoker  . Smokeless tobacco: Never Used  Substance Use Topics  . Alcohol use: No  . Drug use: No     Allergies   Iohexol   Review of Systems Review of Systems 10 Systems reviewed and are negative for acute change except as noted in the HPI.   Physical Exam Updated Vital Signs BP (!) 169/116   Pulse (!) 103   Temp 98.6 F (37 C) (Oral)   Resp (!) 22   SpO2 100%   Physical Exam  Constitutional: She is oriented to person, place, and time.  Patient is alert and appropriate. No respiratory distress at rest.Moderate Obesity.  HENT:  Head: Normocephalic and atraumatic.  Right Ear: External ear normal.  Left Ear: External ear normal.  Nose: Nose normal.  Mouth/Throat: Oropharynx is clear and moist.  Bilateral TMs normal  Eyes: EOM are normal. Pupils are equal, round, and reactive to light.  Neck: Neck supple.  Cardiovascular:  Tachycardia. No gross rub murmur gallop.  Pulmonary/Chest: Effort normal and breath sounds normal.  Abdominal: Soft. She exhibits no distension. There is no tenderness. There is no guarding.  Musculoskeletal: Normal range of motion. She exhibits no edema or tenderness.  Neurological: She is alert and oriented to person, place, and time. No cranial nerve deficit. She exhibits normal muscle tone. Coordination normal.  Skin: Skin is warm and dry.  Psychiatric: She has a normal mood and affect.     ED Treatments / Results  Labs (all labs ordered are listed, but only abnormal results are displayed) Labs Reviewed  INFLUENZA PANEL BY PCR (TYPE A & B)  URINALYSIS, ROUTINE W REFLEX MICROSCOPIC  PREGNANCY, URINE  COMPREHENSIVE METABOLIC PANEL  BRAIN NATRIURETIC PEPTIDE  CBC WITH DIFFERENTIAL/PLATELET  PROTIME-INR  D-DIMER, QUANTITATIVE (NOT AT [facility])  I-STAT TROPONIN, ED    EKG  EKG Interpretation  Date/Time:  Wednesday July 19 2017 22:01:07 EST Ventricular Rate:  101 PR Interval:    QRS Duration: 94 QT  Interval:  372 QTC Calculation: 483 R Axis:   44 Text Interpretation:  Sinus tachycardia Atrial premature complex Abnormal R-wave progression, late transition agree.  no STEMI Confirmed by Arby BarrettePfeiffer, Sabriel Borromeo [phone]([id number]) on 07/19/2017 10:43:22 PM       Radiology Dg Chest 2 View  Result Date: 07/19/2017 CLINICAL DATA:  Flu like symptoms, cough, sore throat, chills and body aches since yesterday EXAM: CHEST  2 VIEW COMPARISON:  11/02/2010 FINDINGS: Enlargement of cardiac silhouette. Mediastinal contours and pulmonary vascularity normal. Question mild RIGHT infrahilar infiltrate medially. Remaining lungs clear. No pleural effusion or pneumothorax. Bones unremarkable. IMPRESSION: Questionable medial RIGHT lower lobe infiltrate. Enlargement of cardiac silhouette. Electronically Signed   By: Ulyses SouthwardMark  Boles M.D.   On: 07/19/2017 22:35    Procedures Procedures (including critical care time)  Medications Ordered in ED Medications  cloNIDine (CATAPRES) tablet 0.1 mg (not administered)  metoCLOPramide (REGLAN) injection 10 mg (10 mg Intravenous Given 07/19/17 2308)  diphenhydrAMINE (BENADRYL) injection 25 mg (25 mg Intravenous Given 07/19/17 2308)  0.9 %  sodium chloride infusion ( Intravenous New Bag/Given 07/19/17 2308)     Initial Impression / Assessment and Plan / ED Course  I have reviewed the triage vital signs and the nursing notes.  Pertinent labs & imaging results that were available during my care of the patient were reviewed by me and considered in my medical decision making (see chart for details).    Plan is to proceed with CT PE study. If confirms pneumonia and rules out PE, will start Zithromax and ED dose of Rocephin. Patient appears to have persistent hypertension. Review of prior visits shows patient had borderline hypertension at that time. She has had persistent hematuria over a number of years. Actually less now than previously. BUN and creatinine are normal. No signs of end organ damage.  Plan will be to start patient on Norvasc 10 mg daily and she is counseled necessity for close follow-up and monitoring with further evaluation for hypertension. Final Clinical Impressions(s) / ED Diagnoses   Final diagnoses:  None    ED Discharge Orders    None       Arby BarrettePfeiffer, Kinslea Frances, MD 07/27/17 [phone]

## 2017-07-19 NOTE — ED Triage Notes (Signed)
Pt reports flu like symptoms, cough, sore throat, chills and body aches since yesterday.

## 2017-07-19 NOTE — ED Notes (Signed)
Patient presented to NF desk inquiring at to whether she had been called. Informed patient that she had been called several times. Patient points to peds waiting area and states "I was sitting over there". Requested that patient sit in adult waiting area. Returned patient's name to waiting list.

## 2017-07-19 NOTE — ED Notes (Signed)
Nurse currently starting IV and will draw labs. 

## 2017-07-19 NOTE — ED Notes (Signed)
Called patient for room multiple times without answer. Assumed to have left department at this time.

## 2017-07-20 ENCOUNTER — Emergency Department (HOSPITAL_COMMUNITY): Payer: Managed Care, Other (non HMO)

## 2017-07-20 LAB — URINALYSIS, ROUTINE W REFLEX MICROSCOPIC
Bilirubin Urine: NEGATIVE
Glucose, UA: NEGATIVE mg/dL
KETONES UR: NEGATIVE mg/dL
Leukocytes, UA: NEGATIVE
Nitrite: NEGATIVE
PH: 5 (ref 5.0–8.0)
Protein, ur: 100 mg/dL — AB
Specific Gravity, Urine: 1.018 (ref 1.005–1.030)

## 2017-07-20 LAB — COMPREHENSIVE METABOLIC PANEL
ALBUMIN: 3.6 g/dL (ref 3.5–5.0)
ALT: 19 U/L (ref 14–54)
ANION GAP: 9 (ref 5–15)
AST: 25 U/L (ref 15–41)
Alkaline Phosphatase: 27 U/L — ABNORMAL LOW (ref 38–126)
BILIRUBIN TOTAL: 0.7 mg/dL (ref 0.3–1.2)
BUN: 8 mg/dL (ref 6–20)
CALCIUM: 9 mg/dL (ref 8.9–10.3)
CHLORIDE: 104 mmol/L (ref 101–111)
CO2: 24 mmol/L (ref 22–32)
CREATININE: 0.9 mg/dL (ref 0.44–1.00)
GFR calc Af Amer: >60 mL/min (ref >60)
GFR calc non Af Amer: >60 mL/min (ref >60)
GLUCOSE: 93 mg/dL (ref 65–99)
POTASSIUM: 3.6 mmol/L (ref 3.5–5.1)
SODIUM: 137 mmol/L (ref 135–145)
Total Protein: 8.7 g/dL — ABNORMAL HIGH (ref 6.5–8.1)

## 2017-07-20 LAB — BRAIN NATRIURETIC PEPTIDE: B NATRIURETIC PEPTIDE 5: 31.4 pg/mL (ref 0.0–100.0)

## 2017-07-20 LAB — D-DIMER, QUANTITATIVE (NOT AT ARMC): D DIMER QUANT: 0.87 ug{FEU}/mL — AB (ref 0.00–0.50)

## 2017-07-20 LAB — PREGNANCY, URINE: PREG TEST UR: NEGATIVE

## 2017-07-20 MED ORDER — AMLODIPINE BESYLATE 5 MG PO TABS
10.0000 mg | ORAL_TABLET | Freq: Once | ORAL | Status: AC
  Administered 2017-07-20: 10 mg via ORAL
  Filled 2017-07-20: qty 2

## 2017-07-20 MED ORDER — AZITHROMYCIN 250 MG PO TABS
500.0000 mg | ORAL_TABLET | Freq: Once | ORAL | Status: AC
  Administered 2017-07-20: 500 mg via ORAL
  Filled 2017-07-20: qty 2

## 2017-07-20 MED ORDER — AMLODIPINE BESYLATE 10 MG PO TABS
10.0000 mg | ORAL_TABLET | Freq: Every day | ORAL | 0 refills | Status: DC
Start: 2017-07-20 — End: 2017-09-07

## 2017-07-20 MED ORDER — AMLODIPINE BESYLATE 5 MG PO TABS: 10.0000 mg | ORAL_TABLET | Freq: Once | ORAL | Status: DC

## 2017-07-20 MED ORDER — AZITHROMYCIN 250 MG PO TABS
250.0000 mg | ORAL_TABLET | Freq: Every day | ORAL | 0 refills | Status: DC
Start: 2017-07-20 — End: 2017-09-07

## 2017-07-20 MED ORDER — IOPAMIDOL (ISOVUE-370) INJECTION 76%
INTRAVENOUS | Status: AC
  Administered 2017-07-20: 100 mL
  Filled 2017-07-20: qty 100

## 2017-07-20 MED ORDER — DEXTROSE 5 % IV SOLN
1.0000 g | Freq: Once | INTRAVENOUS | Status: AC
  Administered 2017-07-20: 1 g via INTRAVENOUS
  Filled 2017-07-20: qty 10

## 2017-07-20 NOTE — ED Notes (Addendum)
Pt returned from CT °

## 2017-07-20 NOTE — ED Provider Notes (Signed)
Patient initially seen by Dr. Donnald GarrePfeiffer with CTA r/o PE pending at the transfer of care to evaluate presenting symptoms with elevated d-dimer.   CTA negative for clot but confirms dx of PNA c/w abnormal x-ray. IV Rocephin and first dose Zithromax provided. VSS.   She can be discharged home per plan of previous treatment team.    [name], Linda Hugh, PA-C 07/20/17 [phone]    Arby BarrettePfeiffer, Marcy, MD 07/27/17 [phone]

## 2017-07-20 NOTE — ED Notes (Addendum)
Patient transported to CT 

## 2017-08-21 ENCOUNTER — Ambulatory Visit: Payer: Self-pay | Admitting: Family Medicine

## 2017-08-21 ENCOUNTER — Ambulatory Visit: Payer: Self-pay | Admitting: Physician Assistant

## 2017-08-21 NOTE — Progress Notes (Deleted)
  No chief complaint on file.   HPI  4 review of systems  Past Medical History:  Diagnosis Date  . Diverticulitis     Current Outpatient Medications  Medication Sig Dispense Refill  . amLODipine (NORVASC) 10 MG tablet Take 1 tablet (10 mg total) by mouth daily. 30 tablet 0  . aspirin-acetaminophen-caffeine (EXCEDRIN MIGRAINE) 250-250-65 MG tablet Take 2 tablets every 6 (six) hours as needed by mouth for headache or migraine.    Marland Kitchen. azithromycin (ZITHROMAX Z-PAK) 250 MG tablet Take 1 tablet (250 mg total) by mouth daily. 4 tablet 0  . ibuprofen (ADVIL,MOTRIN) 200 MG tablet Take 600 mg by mouth every 6 (six) hours as needed for fever, headache or moderate pain.    . Pseudoephedrine-Ibuprofen (ADVIL COLD & SINUS LIQUI-GELS PO) Take 1 tablet by mouth every 6 (six) hours as needed.     No current facility-administered medications for this visit.     Allergies:  Allergies  Allergen Reactions  . Iohexol Rash     Code: RASH, Desc: pt states she had rash at iv site post iv injection, nothing documented in ed notes from prev visits w/ iv ct scans     Past Surgical History:  Procedure Laterality Date  . ABDOMINAL HYSTERECTOMY    . CHOLECYSTECTOMY    . SMALL INTESTINE SURGERY      Social History   Socioeconomic History  . Marital status: Single    Spouse name: Not on file  . Number of children: Not on file  . Years of education: Not on file  . Highest education level: Not on file  Social Needs  . Financial resource strain: Not on file  . Food insecurity - worry: Not on file  . Food insecurity - inability: Not on file  . Transportation needs - medical: Not on file  . Transportation needs - non-medical: Not on file  Occupational History  . Not on file  Tobacco Use  . Smoking status: Never Smoker  . Smokeless tobacco: Never Used  Substance and Sexual Activity  . Alcohol use: No  . Drug use: No  . Sexual activity: Not on file  Other Topics Concern  . Not on file  Social  History Narrative  . Not on file    No family history on file.   ROS Review of Systems See HPI Constitution: No fevers or chills No malaise No diaphoresis Skin: No rash or itching Eyes: no blurry vision, no double vision GU: no dysuria or hematuria Neuro: no dizziness or headaches * all others reviewed and negative   Objective: There were no vitals filed for this visit.  Physical Exam  Assessment and Plan There are no diagnoses linked to this encounter.   Ladarius Seubert P [employer]

## 2017-09-07 ENCOUNTER — Encounter: Payer: Self-pay | Admitting: Primary Care

## 2017-09-07 ENCOUNTER — Ambulatory Visit (INDEPENDENT_AMBULATORY_CARE_PROVIDER_SITE_OTHER): Payer: Managed Care, Other (non HMO) | Admitting: Primary Care

## 2017-09-07 DIAGNOSIS — R030 Elevated blood-pressure reading, without diagnosis of hypertension: Secondary | ICD-10-CM

## 2017-09-07 DIAGNOSIS — G43701 Chronic migraine without aura, not intractable, with status migrainosus: Secondary | ICD-10-CM | POA: Diagnosis not present

## 2017-09-07 DIAGNOSIS — G43909 Migraine, unspecified, not intractable, without status migrainosus: Secondary | ICD-10-CM | POA: Insufficient documentation

## 2017-09-07 DIAGNOSIS — I10 Essential (primary) hypertension: Secondary | ICD-10-CM | POA: Insufficient documentation

## 2017-09-07 NOTE — Assessment & Plan Note (Addendum)
Noted on emergency department visits only. BP today unremarkable. Will have her start monitoring BP at home or elsewhere and report readings at or above 135/90.   Did not take Amlodipine 10 mg that was prescribed. Continue to hold. Discussed the importance of a healthy diet and regular exercise in order for weight loss, and to reduce the risk of any potential medical problems.

## 2017-09-07 NOTE — Patient Instructions (Signed)
Start exercising. You should be getting 150 minutes of moderate intensity exercise weekly.  Increase consumption of vegetables and fruit. Buy whole grains such as quinoa, couscous, millet, barley. Eat lean protein avoid fried protein.  Limit sweets, caloric drinks, processed food.  Ensure you are consuming 64 ounces of water daily.  Monitor your blood pressure and report readings that are consistently at or above 135/90.  Please schedule a physical with me in 2019. You may also schedule a lab only appointment 3-4 days prior. We will discuss your lab results in detail during your physical.  It was a pleasure to meet you today! Please don't hesitate to call or message me with any questions. Welcome to [facility]!

## 2017-09-07 NOTE — Progress Notes (Signed)
Subjective:    Patient ID: [name], female    DOB: 11-Aug-1976, 42 y.o.   MRN: [id number]  HPI  [name] is a 42 year old female who presents today to establish care and discuss the problems mentioned below. Will obtain old records.  1) Elevated Blood Pressure Reading: History of elevated readings during emergency department visits. During her emergency department visit on 07/19/17 she was diagnosed with pneumonia. During her visit on on 06/25/17 she was seen for abdominal pain and diagnosed with diverticulitis.    She does not check her blood pressure at home or elsewhere. She does have a history of migraines but denies frequent headaches. She has not had her blood pressure checked when she wasn't in the emergency department.   BP Readings from Last 3 Encounters:  09/07/17 120/78  07/20/17 (!) 138/99  06/25/17 (!) 153/96   2) Migraines: Began in July 2017. She will experience photophobia and severe headaches to the occipital region of her head. She will take Excedrin Migraine with relief. Migraines are infrequent.   Review of Systems  Constitutional: Negative for fatigue.  Respiratory: Negative for shortness of breath.   Cardiovascular: Negative for chest pain, palpitations and leg swelling.  Skin: Negative for color change.  Neurological: Positive for headaches.       Past Medical History:  Diagnosis Date  . Diverticulitis    November 2018     Social History   Socioeconomic History  . Marital status: Single    Spouse name: Not on file  . Number of children: Not on file  . Years of education: Not on file  . Highest education level: Not on file  Social Needs  . Financial resource strain: Not on file  . Food insecurity - worry: Not on file  . Food insecurity - inability: Not on file  . Transportation needs - medical: Not on file  . Transportation needs - non-medical: Not on file  Occupational History  . Not on file  Tobacco Use  . Smoking status: Never Smoker    . Smokeless tobacco: Never Used  Substance and Sexual Activity  . Alcohol use: No  . Drug use: No  . Sexual activity: Not on file  Other Topics Concern  . Not on file  Social History Narrative   Single but soon to be engaged.   Twin girls.   Works in Data processing managerlogistics through [employer]   Enjoys relaxing and baking    Past Surgical History:  Procedure Laterality Date  . ABDOMINAL HYSTERECTOMY     Partial   . APPENDECTOMY    . CHOLECYSTECTOMY    . SMALL INTESTINE SURGERY     November 2018    Family History  Problem Relation Age of Onset  . Diverticulitis Mother   . Hypertension Mother   . Diabetes Father   . CAD Father   . Hypertension Father   . Eczema Father   . Anxiety disorder Father   . Diverticulitis Sister   . Hypertension Sister   . Breast cancer Maternal Aunt     Allergies  Allergen Reactions  . Iohexol Rash     Code: RASH, Desc: pt states she had rash at iv site post iv injection, nothing documented in ed notes from prev visits w/ iv ct scans     Current Outpatient Medications on File Prior to Visit  Medication Sig Dispense Refill  . aspirin-acetaminophen-caffeine (EXCEDRIN MIGRAINE) 250-250-65 MG tablet Take 2 tablets every 6 (six) hours as needed  by mouth for headache or migraine.     No current facility-administered medications on file prior to visit.     BP 120/78   Pulse 78   Temp 98.2 F (36.8 C) (Oral)   Ht 5' 10.25" (1.784 m)   Wt 212 lb 1.9 oz (96.2 kg)   SpO2 98%   BMI 30.22 kg/m    Objective:   Physical Exam  Constitutional: She appears well-nourished.  Neck: Neck supple.  Cardiovascular: Normal rate and regular rhythm.  Pulmonary/Chest: Effort normal and breath sounds normal.  Skin: Skin is warm and dry.  Psychiatric: She has a normal mood and affect.          Assessment & Plan:

## 2017-09-07 NOTE — Assessment & Plan Note (Signed)
History of since July 2017, overall infrequent and will dissipate with Excedrin Migraine. Will continue to monitor. BP stable.

## 2017-09-26 ENCOUNTER — Encounter (HOSPITAL_COMMUNITY): Payer: Self-pay | Admitting: Emergency Medicine

## 2017-09-26 ENCOUNTER — Emergency Department (HOSPITAL_COMMUNITY)
Admission: EM | Admit: 2017-09-26 | Discharge: 2017-09-26 | Disposition: A | Discharge status: Home / Self Care | Payer: Managed Care, Other (non HMO) | Attending: Emergency Medicine | Admitting: Emergency Medicine

## 2017-09-26 DIAGNOSIS — I10 Essential (primary) hypertension: Secondary | ICD-10-CM

## 2017-09-26 DIAGNOSIS — R030 Elevated blood-pressure reading, without diagnosis of hypertension: Secondary | ICD-10-CM | POA: Diagnosis not present

## 2017-09-26 DIAGNOSIS — Z79899 Other long term (current) drug therapy: Secondary | ICD-10-CM | POA: Diagnosis not present

## 2017-09-26 DIAGNOSIS — R002 Palpitations: Secondary | ICD-10-CM | POA: Diagnosis not present

## 2017-09-26 LAB — CBC
HCT: 37.9 % (ref 36.0–46.0)
Hemoglobin: 12.8 g/dL (ref 12.0–15.0)
MCH: 31.9 pg (ref 26.0–34.0)
MCHC: 33.8 g/dL (ref 30.0–36.0)
MCV: 94.5 fL (ref 78.0–100.0)
PLATELETS: 296 10*3/uL (ref 150–400)
RBC: 4.01 MIL/uL (ref 3.87–5.11)
RDW: 13.3 % (ref 11.5–15.5)
WBC: 5.5 10*3/uL (ref 4.0–10.5)

## 2017-09-26 LAB — URINALYSIS, ROUTINE W REFLEX MICROSCOPIC
Bilirubin Urine: NEGATIVE
GLUCOSE, UA: NEGATIVE mg/dL
Ketones, ur: NEGATIVE mg/dL
Leukocytes, UA: NEGATIVE
NITRITE: NEGATIVE
PH: 5 (ref 5.0–8.0)
Protein, ur: 100 mg/dL — AB
SPECIFIC GRAVITY, URINE: 1.017 (ref 1.005–1.030)

## 2017-09-26 LAB — I-STAT TROPONIN, ED: TROPONIN I, POC: 0.01 ng/mL (ref 0.00–0.08)

## 2017-09-26 LAB — BASIC METABOLIC PANEL
ANION GAP: 12 (ref 5–15)
BUN: 11 mg/dL (ref 6–20)
CALCIUM: 9.3 mg/dL (ref 8.9–10.3)
CO2: 22 mmol/L (ref 22–32)
CREATININE: 0.93 mg/dL (ref 0.44–1.00)
Chloride: 105 mmol/L (ref 101–111)
GLUCOSE: 119 mg/dL — AB (ref 65–99)
Potassium: 3.6 mmol/L (ref 3.5–5.1)
Sodium: 139 mmol/L (ref 135–145)

## 2017-09-26 LAB — I-STAT BETA HCG BLOOD, ED (MC, WL, AP ONLY): I-stat hCG, quantitative: <5 m[IU]/mL (ref <5)

## 2017-09-26 NOTE — ED Triage Notes (Signed)
Pt to ED from home c/o irregular pulse with lightheadedness since last night. Patient states she would check her heart rate and it would be fast and go down - intermittent, has never happened before. Denies CP/SOB, denies palpitations at this time. Ambulatory with steady gait. No hx HTN, but BP high in triage.

## 2017-09-26 NOTE — ED Provider Notes (Signed)
[facility] EMERGENCY DEPARTMENT Provider Note   CSN: [id number] Arrival date & time: 09/26/17  1502     History   Chief Complaint Chief Complaint  Patient presents with  . Palpitations    HPI [name] is a 42 y.o. female.  The history is provided by the patient.  Palpitations   This is a new problem. Episode onset: 1 episode yesterday for 10-15 min and then 1 episode today for 5-10 min. The problem has been resolved. The problem is associated with an unknown factor. Associated symptoms include irregular heartbeat. Pertinent negatives include no diaphoresis, no chest pain, no chest pressure, no exertional chest pressure, no orthopnea, no syncope, no abdominal pain, no nausea, no leg pain, no lower extremity edema, no weakness, no cough and no shortness of breath. She has tried deep relaxation for the symptoms. The treatment provided significant relief. There are no known risk factors. Her past medical history does not include anemia or heart disease. Past medical history comments: pt with HTN here and on prior ER visits but saw PCP 2 weeks ago and told her BP was good.    Past Medical History:  Diagnosis Date  . Diverticulitis    November 2018    Patient Active Problem List   Diagnosis Date Noted  . Migraines 09/07/2017  . Elevated blood pressure reading 09/07/2017    Past Surgical History:  Procedure Laterality Date  . ABDOMINAL HYSTERECTOMY     Partial   . APPENDECTOMY    . CHOLECYSTECTOMY    . SMALL INTESTINE SURGERY     November 2018    OB History    No data available       Home Medications    Prior to Admission medications   Medication Sig Start Date End Date Taking? Authorizing Provider  aspirin-acetaminophen-caffeine (EXCEDRIN MIGRAINE) [phone] MG tablet Take 2 tablets every 6 (six) hours as needed by mouth for headache or migraine.   Yes [provider]  UNABLE TO FIND Black Seed Oil (black cumin seed): Take 2 capsules  (1,000 mg total) by mouth once a day   Yes [provider]    Family History Family History  Problem Relation Age of Onset  . Diverticulitis Mother   . Hypertension Mother   . Diabetes Father   . CAD Father   . Hypertension Father   . Eczema Father   . Anxiety disorder Father   . Diverticulitis Sister   . Hypertension Sister   . Breast cancer Maternal Aunt     Social History Social History   Tobacco Use  . Smoking status: Never Smoker  . Smokeless tobacco: Never Used  Substance Use Topics  . Alcohol use: No  . Drug use: No     Allergies   Patient has no known allergies.   Review of Systems Review of Systems  Constitutional: Negative for diaphoresis.  Respiratory: Negative for cough and shortness of breath.   Cardiovascular: Positive for palpitations. Negative for chest pain, orthopnea and syncope.  Gastrointestinal: Negative for abdominal pain and nausea.  Neurological: Negative for weakness.  All other systems reviewed and are negative.    Physical Exam Updated Vital Signs BP (!) 170/117   Pulse 88   Temp 98.7 F (37.1 C) (Oral)   Resp 19   SpO2 100%   Physical Exam  Constitutional: She is oriented to person, place, and time. She appears well-developed and well-nourished. No distress.  HENT:  Head: Normocephalic and atraumatic.  Mouth/Throat: Oropharynx is clear and moist.  Eyes: Conjunctivae and EOM are normal. Pupils are equal, round, and reactive to light.  Neck: Normal range of motion. Neck supple.  Cardiovascular: Normal rate, regular rhythm and intact distal pulses.  No murmur heard. Pulmonary/Chest: Effort normal and breath sounds normal. No respiratory distress. She has no wheezes. She has no rales.  Musculoskeletal: Normal range of motion. She exhibits no edema or tenderness.  Neurological: She is alert and oriented to person, place, and time.  Skin: Skin is warm and dry. No rash noted. No erythema.  Psychiatric: She has a normal  mood and affect. Her behavior is normal.  Nursing note and vitals reviewed.    ED Treatments / Results  Labs (all labs ordered are listed, but only abnormal results are displayed) Labs Reviewed  BASIC METABOLIC PANEL - Abnormal; Notable for the following components:      Result Value   Glucose, Bld 119 (*)    All other components within normal limits  URINALYSIS, ROUTINE W REFLEX MICROSCOPIC - Abnormal; Notable for the following components:   APPearance CLOUDY (*)    Hgb urine dipstick SMALL (*)    Protein, ur 100 (*)    Bacteria, UA RARE (*)    Squamous Epithelial / LPF 6-30 (*)    Non Squamous Epithelial 0-5 (*)    All other components within normal limits  CBC  I-STAT BETA HCG BLOOD, ED ([facility], [facility], [facility] ONLY)  I-STAT TROPONIN, ED  CBG MONITORING, ED    EKG  EKG Interpretation  Date/Time:  Tuesday September 26 2017 15:17:54 EST Ventricular Rate:  92 PR Interval:  170 QRS Duration: 96 QT Interval:  410 QTC Calculation: 507 R Axis:   54 Text Interpretation:  Normal sinus rhythm Prolonged QT No significant change since last tracing Confirmed by Gwyneth Sprout ([id number]) on 09/26/2017 8:14:51 PM       Radiology No results found.  Procedures Procedures (including critical care time)  Medications Ordered in ED Medications - No data to display   Initial Impression / Assessment and Plan / ED Course  I have reviewed the triage vital signs and the nursing notes.  Pertinent labs & imaging results that were available during my care of the patient were reviewed by me and considered in my medical decision making (see chart for details).     Patient is a healthy 42 year old female with no significant past medical history presenting today with palpitations that occurred once yesterday and once today lasting between 5 and 15 minutes.  Patient was asymptomatic upon arrival here.  However she had never experienced anything like that in the past.  She denies any chest pain, leg  pain, unilateral leg swelling.  She has no risk factors for PE and had testing recently to rule out PE which was negative.  Patient does take Excedrin Migraine intermittently but is not on no other medications that could cause fast heart rate.  She does not drink excessive caffeine and has no prior history of thyroid disease.  Patient is currently asymptomatic but has been persistently hypertensive here.  Patient states she saw her PCP 2 weeks ago and was told her blood pressure was normal.  Looking back over the last year over 3-4 emergency room visits patient has always been hypertensive.  Discussed with patient it is important to call her doctor and follow-up so they can continue to monitor her blood pressure and treat if necessary.  Patient's labs are reassuring here today.  EKG  without evidence of dysrhythmia.  Discussed with her follow-up with cardiology for Holter monitoring.  Also counseled in avoiding caffeinated or stimulant substances.  Final Clinical Impressions(s) / ED Diagnoses   Final diagnoses:  Palpitations  Hypertension, unspecified type    ED Discharge Orders    None       Gwyneth SproutPlunkett, Gyanna Jarema, MD 09/26/17 2106

## 2017-10-04 ENCOUNTER — Other Ambulatory Visit: Payer: Self-pay | Admitting: Primary Care

## 2017-10-04 DIAGNOSIS — Z0000 Encounter for general adult medical examination without abnormal findings: Secondary | ICD-10-CM

## 2017-10-09 ENCOUNTER — Other Ambulatory Visit: Payer: Managed Care, Other (non HMO)

## 2017-10-16 ENCOUNTER — Encounter: Payer: Managed Care, Other (non HMO) | Admitting: Primary Care

## 2017-11-14 ENCOUNTER — Encounter: Payer: Self-pay | Admitting: Physician Assistant

## 2018-03-16 ENCOUNTER — Emergency Department (HOSPITAL_COMMUNITY): Payer: BC Managed Care – PPO

## 2018-03-16 ENCOUNTER — Emergency Department (HOSPITAL_COMMUNITY)
Admission: EM | Admit: 2018-03-16 | Discharge: 2018-03-17 | Disposition: A | Discharge status: Home / Self Care | Payer: BC Managed Care – PPO | Attending: Emergency Medicine | Admitting: Emergency Medicine

## 2018-03-16 ENCOUNTER — Encounter (HOSPITAL_COMMUNITY): Payer: Self-pay | Admitting: *Deleted

## 2018-03-16 ENCOUNTER — Other Ambulatory Visit: Payer: Self-pay

## 2018-03-16 DIAGNOSIS — M79672 Pain in left foot: Secondary | ICD-10-CM

## 2018-03-16 DIAGNOSIS — Z79899 Other long term (current) drug therapy: Secondary | ICD-10-CM | POA: Diagnosis not present

## 2018-03-16 NOTE — ED Triage Notes (Addendum)
Pt reports pain to top of left foot for several days, denies any injury. Reports recent mild swelling. Ambulatory at triage.HTN noted at triage, denies hx of same.

## 2018-03-16 NOTE — ED Notes (Signed)
Pt did not answer x 2 

## 2018-03-17 LAB — CBG MONITORING, ED: Glucose-Capillary: 87 mg/dL (ref 70–99)

## 2018-03-17 NOTE — ED Notes (Signed)
Pt walked back to bed with no assistance or difficulty.

## 2018-03-17 NOTE — ED Notes (Signed)
Pt was in restroom and notified that she will be next for treatment room.

## 2018-03-17 NOTE — ED Notes (Addendum)
Ice Pak applied to LLE

## 2018-03-17 NOTE — ED Provider Notes (Signed)
[facility] EMERGENCY DEPARTMENT Provider Note   CSN: [id number] Arrival date & time: 03/16/18  1724     History   Chief Complaint Chief Complaint  Patient presents with  . Foot Pain    HPI Legacy AANSHI BATCHELDER is a 42 y.o. female.  Patient is a 42 year old female with no significant past medical history.  She presents with complaints of left foot pain for the past several days.  This began while she was sitting in her desk at work in the absence of any injury or trauma.  She describes sharp, "electrical shock like" sensations that shoot them from the top of her foot into her ankle.  This is occurred on several occasions throughout the past several days.  She has never experienced this before.  She has no other significant medical history such as diabetes.     Past Medical History:  Diagnosis Date  . Diverticulitis    November 2018    Patient Active Problem List   Diagnosis Date Noted  . Migraines 09/07/2017  . Elevated blood pressure reading 09/07/2017    Past Surgical History:  Procedure Laterality Date  . ABDOMINAL HYSTERECTOMY     Partial   . APPENDECTOMY    . CHOLECYSTECTOMY    . SMALL INTESTINE SURGERY     November 2018     OB History   None      Home Medications    Prior to Admission medications   Medication Sig Start Date End Date Taking? Authorizing Provider  aspirin-acetaminophen-caffeine (EXCEDRIN MIGRAINE) [phone] MG tablet Take 2 tablets every 6 (six) hours as needed by mouth for headache or migraine.    [provider]  UNABLE TO FIND Black Seed Oil (black cumin seed): Take 2 capsules (1,000 mg total) by mouth once a day    [provider]    Family History Family History  Problem Relation Age of Onset  . Diverticulitis Mother   . Hypertension Mother   . Diabetes Father   . CAD Father   . Hypertension Father   . Eczema Father   . Anxiety disorder Father   . Diverticulitis Sister   . Hypertension Sister    . Breast cancer Maternal Aunt     Social History Social History   Tobacco Use  . Smoking status: Never Smoker  . Smokeless tobacco: Never Used  Substance Use Topics  . Alcohol use: No  . Drug use: No     Allergies   Patient has no known allergies.   Review of Systems Review of Systems  All other systems reviewed and are negative.    Physical Exam Updated Vital Signs BP (!) 186/130 (BP Location: Left Arm)   Pulse 87   Temp 98.4 F (36.9 C) (Oral)   Resp 14   SpO2 100%   Physical Exam  Constitutional: She appears well-developed and well-nourished. No distress.  HENT:  Head: Normocephalic and atraumatic.  Neck: Normal range of motion. Neck supple.  Pulmonary/Chest: Effort normal.  Musculoskeletal:  The left foot appears grossly normal.  There is no significant swelling.  DP pulses are easily palpable and capillary refill is brisk.  Sensation is intact throughout the entire foot.  Neurological: She is alert.  Skin: Skin is warm and dry. She is not diaphoretic.  Nursing note and vitals reviewed.    ED Treatments / Results  Labs (all labs ordered are listed, but only abnormal results are displayed) Labs Reviewed - No data to display  EKG None  Radiology Dg Foot Complete Left  Result Date: 03/16/2018 CLINICAL DATA:  Dorsal left foot pain and swelling for several days. No known injury. EXAM: LEFT FOOT - COMPLETE 3+ VIEW COMPARISON:  None. FINDINGS: Mild diffuse dorsal soft tissue swelling. Minimal dorsal tarsal spur formation. Minimal posterior calcaneal spur formation. Minimal talotibial spur formation. No fracture, dislocation, soft tissue gas, bone destruction or periosteal reaction. IMPRESSION: Dorsal soft tissue swelling and minimal degenerative changes. Electronically Signed   By: Beckie SaltsSteven  Reid M.D.   On: 03/16/2018 19:07    Procedures Procedures (including critical care time)  Medications Ordered in ED Medications - No data to display   Initial  Impression / Assessment and Plan / ED Course  I have reviewed the triage vital signs and the nursing notes.  Pertinent labs & imaging results that were available during my care of the patient were reviewed by me and considered in my medical decision making (see chart for details).  Patient presents with electrical shocklike impulses from the top of her foot extending into her ankle for the past several days.  Her physical examination is unremarkable and her x-rays show no acute process.  I am uncertain as to the exact etiology of her symptoms, however nothing tonight appears emergent.  She will be placed in an Ace bandage, advised to take a regular dose of ibuprofen and follow-up with neurology if her symptoms persist.  Final Clinical Impressions(s) / ED Diagnoses   Final diagnoses:  None    ED Discharge Orders    None       Geoffery Lyonselo, Amorina Doerr, MD 03/17/18 [phone]

## 2018-03-17 NOTE — Discharge Instructions (Addendum)
Wear Ace wrap for the next several days.  Ibuprofen 600 mg 3 times daily for the next 2 to 3 days.  Follow-up with neurology if your symptoms are not improving in the next few days.  [facility] has been provided in this discharge summary for you to call and make these arrangements.

## 2018-04-02 ENCOUNTER — Ambulatory Visit: Payer: Self-pay | Admitting: *Deleted

## 2018-04-02 NOTE — Telephone Encounter (Signed)
I will see her then  

## 2018-04-02 NOTE — Telephone Encounter (Signed)
Pt called with complaints of elevated BP 186/130 on 03/16/18 and left foot pain;that reading from an ED visit; the pt is most concerned about her blood pressure and believes it is a catalyst to her foot; she states that her foot started hurting 03/14/18; recommendations made per nurse triage to include seeing a physician within 3 days; the pt normally sees Mayra ReelKate Clark but she has no availability set per protocol guidelines; pt offered an appointment on 04/03/18 at 0900 with Nicki Reaperegina Baity but she is unable to accept this due to a meeting at work; discussed scheduling difficulties with Artelia LarocheRena; per Artelia Larocheena pt is offered and accepts appointment with Dr Milinda Antisower on 04/04/18 at 1600; she verbalizes understanding; will route to office for notification of this upcoming appointment.     Reason for Disposition . Systolic BP  >= 160 OR Diastolic >= 100  Answer Assessment - Initial Assessment Questions 1. BLOOD PRESSURE: "What is the blood pressure?" "Did you take at least two measurements 5 minutes apart?"      2. ONSET: "When did you take your blood pressure?"  03/16/18 3. HOW: "How did you obtain the blood pressure?" (e.g., visiting nurse, automatic home BP monitor)     03/16/18 4. HISTORY: "Do you have a history of high blood pressure?"     no 5. MEDICATIONS: "Are you taking any medications for blood pressure?" "Have you missed any doses recently?"     no 6. OTHER SYMPTOMS: "Do you have any symptoms?" (e.g., headache, chest pain, blurred vision, difficulty breathing, weakness)   Severe headaches in the back of her head, fluid retention/swelling L>R   7. PREGNANCY: "Is there any chance you are pregnant?" "When was your last menstrual period?"   No hysterectomy  Protocols used: HIGH BLOOD PRESSURE-A-AH

## 2018-04-02 NOTE — Telephone Encounter (Signed)
Pt has foot pain, elevated BP at ED visit 186/130 on 03/16/18; pt is not on BP medication.Per Asencion GowdaKatrese RN pt does not have a way to ck BP. Pt has H/A with hx of migraines [facility]. Scheduled 45' appt with Dr Milinda Antisower on 04/04/18 at 4pm per [place] CMA. Allayne GitelmanK Clark NP is out of office. I spoke with pt and if condition changes or worsens prior to appt pt will go back to ED. 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 FYI to DR SunTrustower.

## 2018-04-04 ENCOUNTER — Ambulatory Visit: Payer: BC Managed Care – PPO | Admitting: Family Medicine

## 2018-04-04 ENCOUNTER — Encounter: Payer: Self-pay | Admitting: Family Medicine

## 2018-04-04 ENCOUNTER — Telehealth: Payer: Self-pay | Admitting: Primary Care

## 2018-04-04 VITALS — BP 144/84 | HR 86 | Temp 98.4°F | Ht 70.25 in | Wt 276.5 lb

## 2018-04-04 DIAGNOSIS — R600 Localized edema: Secondary | ICD-10-CM | POA: Diagnosis not present

## 2018-04-04 DIAGNOSIS — G43701 Chronic migraine without aura, not intractable, with status migrainosus: Secondary | ICD-10-CM

## 2018-04-04 DIAGNOSIS — I10 Essential (primary) hypertension: Secondary | ICD-10-CM

## 2018-04-04 MED ORDER — HYDROCHLOROTHIAZIDE 25 MG PO TABS: 25.0000 mg | ORAL_TABLET | Freq: Every day | ORAL | 11 refills | Status: DC

## 2018-04-04 NOTE — Assessment & Plan Note (Signed)
Poss due to wt gain/venous insuff/ sodium in diet  No cardiac symptoms Recommend elevation when sitting Diet/exercise/wt loss Less processed foods  hctz 25 mg daily for this and HTN F/u 2 weeks

## 2018-04-04 NOTE — Patient Instructions (Signed)
Check out a meditation app for your phone - like Calm , Headspace, Smiling mind  They have some great sleep programs   Try to start getting to bed an hour earlier  Start HCTZ 25 mg each am - to help with swelling and blood pressure  It will make you urinate more   Avoid excessive caffeine / keep drinking lots of water  Eat a healthy  Walk/exercise when you can   I hope that decreasing blood pressure will help the headaches as well   Follow up with Jae DireKate in about 2 weeks

## 2018-04-04 NOTE — Assessment & Plan Note (Signed)
bp gradually going up (pos fam hx and also wt gain) Disc lifestyle change  BP Readings from Last 1 Encounters:  04/04/18 (!) 144/84   Trial of hctz 25 mg daily-may also help pedal edema Better bp may slow down headaches  Most recent labs reviewed  Disc lifstyle change with low sodium diet and exercise  F/u with pcp in about 2 wk for visit and labs

## 2018-04-04 NOTE — Assessment & Plan Note (Signed)
Worse lately-more posterior and walking up with them  Disc potential triggers incl caffine/bad sleep habits/stress/allergies Suspect HTN is starting to play more of a role Will tx that with hctz 25 mg daily to start DASH diet  Lifestyle change  A beta blocker or ca channel blocker may be helpful if needed for both HA and HTN  Also HA prophylaxis is considerable if no imp

## 2018-04-04 NOTE — Telephone Encounter (Signed)
Pt wants to do labs for physical when she comes in on 9/11. Is this ok?

## 2018-04-04 NOTE — Telephone Encounter (Signed)
Please notify patient that this is perfectly fine. We will see her on 09/11.

## 2018-04-04 NOTE — Progress Notes (Signed)
Subjective:    Patient ID: [name], female    DOB: 1976/02/01, 42 y.o.   MRN: [id number]  HPI  42 yo pt of NP Clark here for ED f/u on 8/2 for L foot pain  Also brings up elevated bp and headache (hx of migraine) Hx as follows: She presents with complaints of left foot pain for the past several days.  This began while she was sitting in her desk at work in the absence of any injury or trauma.  She describes sharp, "electrical shock like" sensations that shoot them from the top of her foot into her ankle.  This is occurred on several occasions throughout the past several days.  She has never experienced this before.  She has no other significant medical history such as diabetes.  Now she has swelling on top of the foot  If she sits for 30 minutes- feet feel swollen when she walks after sitting  Going on "for a while" -at least 2-3 mo feet have been swelling  Heels do not hurt  Ankles feel tight and swollen  No heat- they feel cold and no redness   Waking up with a migraine more often - twice weekly or more  Still takes excedrin migraine- takes 30 min to work  Headache is in the center /back of head  Throbs  Light does bother her /sound --for example going to the movies   Nl exam Foot XR done Dg Foot Complete Left  Result Date: 03/16/2018 CLINICAL DATA:  Dorsal left foot pain and swelling for several days. No known injury. EXAM: LEFT FOOT - COMPLETE 3+ VIEW COMPARISON:  None. FINDINGS: Mild diffuse dorsal soft tissue swelling. Minimal dorsal tarsal spur formation. Minimal posterior calcaneal spur formation. Minimal talotibial spur formation. No fracture, dislocation, soft tissue gas, bone destruction or periosteal reaction. IMPRESSION: Dorsal soft tissue swelling and minimal degenerative changes. Electronically Signed   By: Beckie SaltsSteven  Reid M.D.   On: 03/16/2018 19:07   She was placed in ACE bandage Adv to use ibuprofen  Adv to consider neurology visit if symptoms continue    Blood  pressure  BP Readings from Last 3 Encounters:  04/04/18 (!) 144/84  03/17/18 (!) 185/103  09/26/17 (!) 170/117   Is usually elevated in the ED Has been px amlodipine (per hx) in the past (took it shortly)   HTN runs in the family   She treats headaches (migraine) with excedrin migraine otc  Wt Readings from Last 3 Encounters:  04/04/18 276 lb 8 oz (125.4 kg)  09/07/17 212 lb 1.9 oz (96.2 kg)  06/25/17 170 lb (77.1 kg)   39.39 kg/m   Lab Results  Component Value Date   CREATININE 0.93 09/26/2017   BUN 11 09/26/2017   NA 139 09/26/2017   K 3.6 09/26/2017   CL 105 09/26/2017   CO2 22 09/26/2017    Drinks a lot of water  1 cup of coffee in am   Gets off schedule in the summer - sleep and rest wise  Tries to be asleep 11:30 and gets up at 6  During the school year it gets better  Tries to exercise -really likes to walk -but less due to foot problems lately  Stress has improved lately  Allergies are not too bad right now    Patient Active Problem List   Diagnosis Date Noted  . Pedal edema 04/04/2018  . Migraines 09/07/2017  . Essential hypertension 09/07/2017   Past Medical History:  Diagnosis Date  . Diverticulitis    November 2018   Past Surgical History:  Procedure Laterality Date  . ABDOMINAL HYSTERECTOMY     Partial   . APPENDECTOMY    . CHOLECYSTECTOMY    . SMALL INTESTINE SURGERY     November 2018   Social History   Tobacco Use  . Smoking status: Never Smoker  . Smokeless tobacco: Never Used  Substance Use Topics  . Alcohol use: No  . Drug use: No   Family History  Problem Relation Age of Onset  . Diverticulitis Mother   . Hypertension Mother   . Diabetes Father   . CAD Father   . Hypertension Father   . Eczema Father   . Anxiety disorder Father   . Diverticulitis Sister   . Hypertension Sister   . Breast cancer Maternal Aunt    No Known Allergies Current Outpatient Medications on File Prior to Visit  Medication Sig Dispense  Refill  . aspirin-acetaminophen-caffeine (EXCEDRIN MIGRAINE) 250-250-65 MG tablet Take 2 tablets every 6 (six) hours as needed by mouth for headache or migraine.    Marland Kitchen UNABLE TO FIND Black Seed Oil (black cumin seed): Take 2 capsules (1,000 mg total) 3-4 times weekly     No current facility-administered medications on file prior to visit.     Review of Systems  Constitutional: Positive for fatigue. Negative for activity change, appetite change, fever and unexpected weight change.  HENT: Negative for congestion, ear pain, rhinorrhea, sinus pressure and sore throat.   Eyes: Negative for pain, redness and visual disturbance.  Respiratory: Negative for cough, shortness of breath, wheezing and stridor.   Cardiovascular: Positive for leg swelling. Negative for chest pain and palpitations.  Gastrointestinal: Negative for abdominal pain, blood in stool, constipation and diarrhea.  Endocrine: Negative for polydipsia and polyuria.  Genitourinary: Negative for dysuria, frequency and urgency.  Musculoskeletal: Positive for arthralgias. Negative for back pain and myalgias.  Skin: Negative for pallor and rash.  Allergic/Immunologic: Negative for environmental allergies.  Neurological: Positive for headaches. Negative for dizziness, tremors, seizures, syncope, facial asymmetry, speech difficulty, weakness and numbness.       Had some episodes of fleeting electric type foot pain- did not re occur  Hematological: Negative for adenopathy. Does not bruise/bleed easily.  Psychiatric/Behavioral: Negative for decreased concentration and dysphoric mood. The patient is not nervous/anxious.        Objective:   Physical Exam  Constitutional: She is oriented to person, place, and time. She appears well-developed and well-nourished. No distress.  HENT:  Head: Normocephalic and atraumatic.  Right Ear: External ear normal.  Left Ear: External ear normal.  Nose: Nose normal.  Mouth/Throat: Oropharynx is clear and  moist. No oropharyngeal exudate.  No sinus tenderness No temporal tenderness  No TMJ tenderness Nares are boggy  Eyes: Pupils are equal, round, and reactive to light. Conjunctivae and EOM are normal. Right eye exhibits no discharge. Left eye exhibits no discharge. No scleral icterus.  No nystagmus  Neck: Normal range of motion and full passive range of motion without pain. Neck supple. No JVD present. Carotid bruit is not present. No tracheal deviation present. No thyromegaly present.  Cardiovascular: Normal rate, regular rhythm and normal heart sounds.  No murmur heard. Pulmonary/Chest: Effort normal and breath sounds normal. No respiratory distress. She has no wheezes. She has no rales.  Abdominal: Soft. Bowel sounds are normal. She exhibits no distension, no abdominal bruit and no mass. There is no tenderness.  Musculoskeletal: She  exhibits edema. She exhibits no tenderness.  Ankle and foot puffiness w/o pitting -bilateral  Dorsal feet-most evident   No acute joint changes  No palp cords /tenderness/ homans sign  Lymphadenopathy:    She has no cervical adenopathy.  Neurological: She is alert and oriented to person, place, and time. She has normal strength and normal reflexes. She displays no atrophy and no tremor. No cranial nerve deficit or sensory deficit. She exhibits normal muscle tone. She displays a negative Romberg sign. Coordination and gait normal.  No focal cerebellar signs   Skin: Skin is warm and dry. No rash noted. No pallor.  Psychiatric: She has a normal mood and affect. Her behavior is normal. Thought content normal.  pleasant          Assessment & Plan:   Problem List Items Addressed This Visit      Cardiovascular and Mediastinum   Essential hypertension - Primary    bp gradually going up (pos fam hx and also wt gain) Disc lifestyle change  BP Readings from Last 1 Encounters:  04/04/18 (!) 144/84   Trial of hctz 25 mg daily-may also help pedal  edema Better bp may slow down headaches  Most recent labs reviewed  Disc lifstyle change with low sodium diet and exercise  F/u with pcp in about 2 wk for visit and labs       Relevant Medications   hydrochlorothiazide (HYDRODIURIL) 25 MG tablet   Migraines    Worse lately-more posterior and walking up with them  Disc potential triggers incl caffine/bad sleep habits/stress/allergies Suspect HTN is starting to play more of a role Will tx that with hctz 25 mg daily to start DASH diet  Lifestyle change  A beta blocker or ca channel blocker may be helpful if needed for both HA and HTN  Also HA prophylaxis is considerable if no imp       Relevant Medications   hydrochlorothiazide (HYDRODIURIL) 25 MG tablet     Other   Pedal edema    Poss due to wt gain/venous insuff/ sodium in diet  No cardiac symptoms Recommend elevation when sitting Diet/exercise/wt loss Less processed foods  hctz 25 mg daily for this and HTN F/u 2 weeks

## 2018-04-25 ENCOUNTER — Ambulatory Visit: Payer: BC Managed Care – PPO | Admitting: Primary Care

## 2018-05-02 ENCOUNTER — Other Ambulatory Visit: Payer: No Typology Code available for payment source

## 2018-05-04 ENCOUNTER — Ambulatory Visit: Payer: BC Managed Care – PPO | Admitting: Primary Care

## 2018-05-09 ENCOUNTER — Encounter: Payer: BC Managed Care – PPO | Admitting: Primary Care

## 2018-05-09 DIAGNOSIS — Z0289 Encounter for other administrative examinations: Secondary | ICD-10-CM

## 2018-08-19 ENCOUNTER — Other Ambulatory Visit: Payer: Self-pay

## 2018-08-19 ENCOUNTER — Encounter: Payer: Self-pay | Admitting: Emergency Medicine

## 2018-08-19 ENCOUNTER — Emergency Department: Payer: BC Managed Care – PPO

## 2018-08-19 ENCOUNTER — Emergency Department
Admission: EM | Admit: 2018-08-19 | Discharge: 2018-08-19 | Disposition: A | Discharge status: Home / Self Care | Payer: BC Managed Care – PPO | Attending: Emergency Medicine | Admitting: Emergency Medicine

## 2018-08-19 DIAGNOSIS — I10 Essential (primary) hypertension: Secondary | ICD-10-CM | POA: Diagnosis not present

## 2018-08-19 DIAGNOSIS — M7531 Calcific tendinitis of right shoulder: Secondary | ICD-10-CM | POA: Diagnosis not present

## 2018-08-19 DIAGNOSIS — M25511 Pain in right shoulder: Secondary | ICD-10-CM | POA: Diagnosis present

## 2018-08-19 DIAGNOSIS — Z79899 Other long term (current) drug therapy: Secondary | ICD-10-CM | POA: Insufficient documentation

## 2018-08-19 HISTORY — DX: Essential (primary) hypertension: I10

## 2018-08-19 MED ORDER — KETOROLAC TROMETHAMINE 30 MG/ML IJ SOLN
30.0000 mg | Freq: Once | INTRAMUSCULAR | Status: AC
  Administered 2018-08-19: 30 mg via INTRAMUSCULAR
  Filled 2018-08-19: qty 1

## 2018-08-19 MED ORDER — TRAMADOL HCL 50 MG PO TABS: 50.0000 mg | ORAL_TABLET | Freq: Four times a day (QID) | ORAL | 0 refills | Status: DC | PRN

## 2018-08-19 MED ORDER — PREDNISONE 10 MG PO TABS: ORAL_TABLET | ORAL | 0 refills | Status: DC

## 2018-08-19 MED ORDER — HYDROCODONE-ACETAMINOPHEN 5-325 MG PO TABS
1.0000 | ORAL_TABLET | Freq: Once | ORAL | Status: AC
  Administered 2018-08-19: 1 via ORAL
  Filled 2018-08-19: qty 1

## 2018-08-19 NOTE — ED Notes (Signed)
Pt c/o 2 days of right shoulder pain, decreased ROM due to pain,

## 2018-08-19 NOTE — ED Triage Notes (Signed)
Pt to ED via POV c/o right shoulder and arm swelling and pain. Pt states that the pain started 2 days ago. Pts arm is warm to touch and good radial pulse is present. Pt is in NAD at this time.

## 2018-08-19 NOTE — ED Provider Notes (Signed)
[facility] Emergency Department Provider Note  ____________________________________________   First MD Initiated Contact with Patient 08/19/18 [phone]     (approximate)  I have reviewed the triage vital signs and the nursing notes.   HISTORY  Chief Complaint Shoulder Pain   HPI [name] is a 43 y.o. female presents to the ED with complaint of right shoulder pain that is started 2 days ago.  Patient denies any injury to her shoulder recently or in the past.  Patient has taken some over-the-counter medication without any relief.  Patient states that she is unable to get in a comfortable position especially at night when she is sleeping.  She rates her pain as an 8 out of 10.   Past Medical History:  Diagnosis Date  . Diverticulitis    November 2018  . Hypertension     Patient Active Problem List   Diagnosis Date Noted  . Pedal edema 04/04/2018  . Migraines 09/07/2017  . Essential hypertension 09/07/2017    Past Surgical History:  Procedure Laterality Date  . ABDOMINAL HYSTERECTOMY     Partial   . APPENDECTOMY    . CHOLECYSTECTOMY    . SMALL INTESTINE SURGERY     November 2018    Prior to Admission medications   Medication Sig Start Date End Date Taking? Authorizing Provider  aspirin-acetaminophen-caffeine (EXCEDRIN MIGRAINE) [id number] MG tablet Take 2 tablets every 6 (six) hours as needed by mouth for headache or migraine.    [provider]  hydrochlorothiazide (HYDRODIURIL) 25 MG tablet Take 1 tablet (25 mg total) by mouth daily. 04/04/18   Tower, Audrie Gallus, MD  predniSONE (DELTASONE) 10 MG tablet Take 6 tablets  today, on day 2 take 5 tablets, day 3 take 4 tablets, day 4 take 3 tablets, day 5 take  2 tablets and 1 tablet the last day 08/19/18   Tommi Rumps, PA-C  traMADol (ULTRAM) 50 MG tablet Take 1 tablet (50 mg total) by mouth every 6 (six) hours as needed. 08/19/18   Tommi Rumps, PA-C  UNABLE TO FIND Black Seed Oil  (black cumin seed): Take 2 capsules (1,000 mg total) 3-4 times weekly    [provider]    Allergies Patient has no known allergies.  Family History  Problem Relation Age of Onset  . Diverticulitis Mother   . Hypertension Mother   . Diabetes Father   . CAD Father   . Hypertension Father   . Eczema Father   . Anxiety disorder Father   . Diverticulitis Sister   . Hypertension Sister   . Breast cancer Maternal Aunt     Social History Social History   Tobacco Use  . Smoking status: Never Smoker  . Smokeless tobacco: Never Used  Substance Use Topics  . Alcohol use: No  . Drug use: No    Review of Systems Constitutional: No fever/chills Cardiovascular: Denies chest pain. Respiratory: Denies shortness of breath. Musculoskeletal: Positive right shoulder pain. Skin: Negative for rash. Neurological: Negative for headaches, focal weakness or numbness. ____________________________________________   PHYSICAL EXAM:  VITAL SIGNS: ED Triage Vitals  Enc Vitals Group     BP 08/19/18 0822 (!) 161/117     Pulse Rate 08/19/18 0822 (!) 109     Resp 08/19/18 0822 16     Temp 08/19/18 0822 98.1 F (36.7 C)     Temp Source 08/19/18 0822 Oral     SpO2 08/19/18 0822 99 %     Weight  08/19/18 0823 215 lb (97.5 kg)     Height 08/19/18 0823 5\' 11"  (1.803 m)     Head Circumference --      Peak Flow --      Pain Score 08/19/18 0822 8     Pain Loc --      Pain Edu? --      Excl. in GC? --    Constitutional: Alert and oriented. Well appearing and in no acute distress. Eyes: Conjunctivae are normal.  Head: Atraumatic. Mouth/Throat: Mucous membranes are moist.  Oropharynx non-erythematous. Neck: No stridor.   Cardiovascular: Normal rate, regular rhythm. Grossly normal heart sounds.  Good peripheral circulation. Respiratory: Normal respiratory effort.  No retractions. Lungs CTAB. Musculoskeletal: On examination of the right shoulder there is no gross deformity and no evidence  that suggest injury.  There is no erythema or warmth to palpation.  Patient is markedly tender to palpation generalized in the [facility] and deltoid area.  Range of motion is markedly restricted secondary to pain.  There is no crepitus noted with range of motion.  Skin is intact.  Pulse present.  Motor sensory function intact distal to her pain. Neurologic:  Normal speech and language. No gross focal neurologic deficits are appreciated.  Skin:  Skin is warm, dry and intact.  Psychiatric: Mood and affect are normal. Speech and behavior are normal.  ____________________________________________   LABS (all labs ordered are listed, but only abnormal results are displayed)  Labs Reviewed - No data to display  RADIOLOGY  Official radiology report(s): Dg Shoulder Right  Result Date: 08/19/2018 CLINICAL DATA:  Right shoulder pain for 2-3 days. Limited range of motion. EXAM: RIGHT SHOULDER - 2+ VIEW COMPARISON:  None. FINDINGS: Abnormal calcification above the humeral head suggesting calcific rotator cuff tendinopathy likely involving the supraspinatus tendon and questionably the infraspinatus tendon. AC joint alignment normal. Normal glenohumeral alignment. Mild spurring of the humeral head and inferior glenoid rim. IMPRESSION: 1. Calcific rotator cuff tendinopathy. 2. Mild degenerative glenohumeral arthropathy. Electronically Signed   By: Gaylyn Rong M.D.   On: 08/19/2018 10:09    ____________________________________________   PROCEDURES  Procedure(s) performed: None  Procedures  Critical Care performed: No  ____________________________________________   INITIAL IMPRESSION / ASSESSMENT AND PLAN / ED COURSE  As part of my medical decision making, I reviewed the following data within the electronic MEDICAL RECORD NUMBER Notes from prior ED visits and [place] Controlled Substance Database  Patient presents to the ED with complaint of 2 days right shoulder pain without history of injury.  Patient  denies any past injury as well.  She has been taking over-the-counter medication without any relief.  Physical exam patient has marked restriction secondary to her pain.  On x-ray there is a spur present on the humeral head which most likely is because of her pain.  Patient was given Toradol IM along with Norco prior to going to x-ray department.  We discussed the findings of her x-ray.  Patient was discharged with prescription for tapering dose of prednisone and tramadol if needed for moderate pain.  She is also to follow-up with Dr. Joice Lofts who is on-call for orthopedics for continued care.   ____________________________________________   FINAL CLINICAL IMPRESSION(S) / ED DIAGNOSES  Final diagnoses:  Calcific tendinitis of right shoulder     ED Discharge Orders         Ordered    predniSONE (DELTASONE) 10 MG tablet     08/19/18 1033    traMADol (ULTRAM) 50 MG  tablet  Every 6 hours PRN     08/19/18 1033           Note:  This document was prepared using Dragon voice recognition software and may include unintentional dictation errors.    Tommi RumpsSummers, Yer Castello L, PA-C 08/19/18 1153    Minna AntisPaduchowski, Kevin, MD 08/19/18 1451

## 2018-08-19 NOTE — Discharge Instructions (Addendum)
Follow-up with your primary care provider if any continued problems or make an appointment with the orthopedist listed on your discharge papers.  A prednisone pack was sent to your pharmacy to begin taking today.  Taper down 1 tablet each day until finished.  Also tramadol 1 every 6 hours as needed for pain.  Do not drive or operate machinery while taking that medication.  This medication is for moderate to severe pain if needed and can be taken along with prednisone.  Otherwise you can take Tylenol with the prednisone if needed for mild to moderate pain.  You may use heat or ice to your shoulder as needed for discomfort.

## 2018-08-19 NOTE — ED Notes (Addendum)
Pt instructed to follow up with ortho or PCP and to take BP medications as soon as possible. Pt verbalized understanding.

## 2018-09-06 ENCOUNTER — Other Ambulatory Visit: Payer: Self-pay | Admitting: Primary Care

## 2018-09-06 DIAGNOSIS — Z1231 Encounter for screening mammogram for malignant neoplasm of breast: Secondary | ICD-10-CM

## 2018-09-10 ENCOUNTER — Other Ambulatory Visit: Payer: Self-pay | Admitting: Primary Care

## 2018-09-10 DIAGNOSIS — I10 Essential (primary) hypertension: Secondary | ICD-10-CM

## 2018-09-12 LAB — HM MAMMOGRAPHY

## 2018-09-13 LAB — HM PAP SMEAR: HM PAP: NEGATIVE

## 2018-09-14 ENCOUNTER — Other Ambulatory Visit (INDEPENDENT_AMBULATORY_CARE_PROVIDER_SITE_OTHER): Payer: BC Managed Care – PPO

## 2018-09-14 DIAGNOSIS — I10 Essential (primary) hypertension: Secondary | ICD-10-CM

## 2018-09-15 LAB — COMPREHENSIVE METABOLIC PANEL
AG RATIO: 0.9 (calc) — AB (ref 1.0–2.5)
ALKALINE PHOSPHATASE (APISO): 24 U/L — AB (ref 33–115)
ALT: 23 U/L (ref 6–29)
AST: 28 U/L (ref 10–30)
Albumin: 3.7 g/dL (ref 3.6–5.1)
BUN: 7 mg/dL (ref 7–25)
CALCIUM: 9.4 mg/dL (ref 8.6–10.2)
CO2: 30 mmol/L (ref 20–32)
Chloride: 102 mmol/L (ref 98–110)
Creat: 0.99 mg/dL (ref 0.50–1.10)
GLOBULIN: 4.2 g/dL — AB (ref 1.9–3.7)
Glucose, Bld: 88 mg/dL (ref 65–99)
Potassium: 3.7 mmol/L (ref 3.5–5.3)
Sodium: 139 mmol/L (ref 135–146)
Total Bilirubin: 0.4 mg/dL (ref 0.2–1.2)
Total Protein: 7.9 g/dL (ref 6.1–8.1)

## 2018-09-15 LAB — HEMOGLOBIN A1C
HEMOGLOBIN A1C: 6.3 %{Hb} — AB (ref <5.7)
MEAN PLASMA GLUCOSE: 134 (calc)
eAG (mmol/L): 7.4 (calc)

## 2018-09-15 LAB — LIPID PANEL
CHOLESTEROL: 174 mg/dL (ref <200)
HDL: 34 mg/dL — AB (ref >50)
LDL Cholesterol (Calc): 113 mg/dL (calc) — ABNORMAL HIGH
Non-HDL Cholesterol (Calc): 140 mg/dL (calc) — ABNORMAL HIGH (ref <130)
Total CHOL/HDL Ratio: 5.1 (calc) — ABNORMAL HIGH (ref <5.0)
Triglycerides: 153 mg/dL — ABNORMAL HIGH (ref <150)

## 2018-09-21 ENCOUNTER — Ambulatory Visit (INDEPENDENT_AMBULATORY_CARE_PROVIDER_SITE_OTHER): Payer: BC Managed Care – PPO | Admitting: Primary Care

## 2018-09-21 ENCOUNTER — Encounter: Payer: Self-pay | Admitting: Primary Care

## 2018-09-21 VITALS — BP 128/86 | HR 78 | Temp 98.2°F | Ht 70.25 in | Wt 273.5 lb

## 2018-09-21 DIAGNOSIS — G43701 Chronic migraine without aura, not intractable, with status migrainosus: Secondary | ICD-10-CM

## 2018-09-21 DIAGNOSIS — Z0000 Encounter for general adult medical examination without abnormal findings: Secondary | ICD-10-CM | POA: Diagnosis not present

## 2018-09-21 DIAGNOSIS — R7303 Prediabetes: Secondary | ICD-10-CM | POA: Diagnosis not present

## 2018-09-21 DIAGNOSIS — I10 Essential (primary) hypertension: Secondary | ICD-10-CM

## 2018-09-21 DIAGNOSIS — Z0001 Encounter for general adult medical examination with abnormal findings: Secondary | ICD-10-CM | POA: Insufficient documentation

## 2018-09-21 DIAGNOSIS — R002 Palpitations: Secondary | ICD-10-CM

## 2018-09-21 LAB — TSH: TSH: 1.51 u[IU]/mL (ref 0.35–4.50)

## 2018-09-21 NOTE — Patient Instructions (Signed)
Stop by the lab prior to leaving today. I will notify you of your results once received.   Start monitoring your blood pressure daily, around the same time of day, for the next 2-3 weeks.  Ensure that you have rested for 30 minutes prior to checking your blood pressure. Record your readings and notify me if you see consistent readings at or above 135/90.   Start exercising. You should be getting 150 minutes of moderate intensity exercise weekly.  It's important to improve your diet by reducing consumption of fast food, fried food, processed snack foods, sugary drinks. Increase consumption of fresh vegetables and fruits, whole grains, water.  Ensure you are drinking 64 ounces of water daily.  Schedule a lab only appointment for 3 months to repeat the diabetes test.  It was a pleasure to see you today!    Preventing Type 2 Diabetes Mellitus Type 2 diabetes (type 2 diabetes mellitus) is a long-term (chronic) disease that affects blood sugar (glucose) levels. Normally, a hormone called insulin allows glucose to enter cells in the body. The cells use glucose for energy. In type 2 diabetes, one or both of these problems may be present:  The body does not make enough insulin.  The body does not respond properly to insulin that it makes (insulin resistance). Insulin resistance or lack of insulin causes excess glucose to build up in the blood instead of going into cells. As a result, high blood glucose (hyperglycemia) develops, which can cause many complications. Being overweight or obese and having an inactive (sedentary) lifestyle can increase your risk for diabetes. Type 2 diabetes can be delayed or prevented by making certain nutrition and lifestyle changes. What nutrition changes can be made?   Eat healthy meals and snacks regularly. Keep a healthy snack with you for when you get hungry between meals, such as fruit or a handful of nuts.  Eat lean meats and proteins that are low in saturated  fats, such as chicken, fish, egg whites, and beans. Avoid processed meats.  Eat plenty of fruits and vegetables and plenty of grains that have not been processed (whole grains). It is recommended that you eat: ? 1?2 cups of fruit every day. ? 2?3 cups of vegetables every day. ? 6?8 oz of whole grains every day, such as oats, whole wheat, bulgur, brown rice, quinoa, and millet.  Eat low-fat dairy products, such as milk, yogurt, and cheese.  Eat foods that contain healthy fats, such as nuts, avocado, olive oil, and canola oil.  Drink water throughout the day. Avoid drinks that contain added sugar, such as soda or sweet tea.  Follow instructions from your health care provider about specific eating or drinking restrictions.  Control how much food you eat at a time (portion size). ? Check food labels to find out the serving sizes of foods. ? Use a kitchen scale to weigh amounts of foods.  Saute or steam food instead of frying it. Cook with water or broth instead of oils or butter.  Limit your intake of: ? Salt (sodium). Have no more than 1 tsp (2,400 mg) of sodium a day. If you have heart disease or high blood pressure, have less than ? tsp (1,500 mg) of sodium a day. ? Saturated fat. This is fat that is solid at room temperature, such as butter or fat on meat. What lifestyle changes can be made? Activity   Do moderate-intensity physical activity for at least 30 minutes on at least 5 days of the  week, or as much as told by your health care provider.  Ask your health care provider what activities are safe for you. A mix of physical activities may be best, such as walking, swimming, cycling, and strength training.  Try to add physical activity into your day. For example: ? Park in spots that are farther away than usual, so that you walk more. For example, park in a far corner of the parking lot when you go to the office or the grocery store. ? Take a walk during your lunch  break. ? Use stairs instead of elevators or escalators. Weight Loss  Lose weight as directed. Your health care provider can determine how much weight loss is best for you and can help you lose weight safely.  If you are overweight or obese, you may be instructed to lose at least 5?7 % of your body weight. Alcohol and Tobacco   Limit alcohol intake to no more than 1 drink a day for nonpregnant women and 2 drinks a day for men. One drink equals 12 oz of beer, 5 oz of wine, or 1 oz of hard liquor.  Do not use any tobacco products, such as cigarettes, chewing tobacco, and e-cigarettes. If you need help quitting, ask your health care provider. Work With Your Health Care Provider  Have your blood glucose tested regularly, as told by your health care provider.  Discuss your risk factors and how you can reduce your risk for diabetes.  Get screening tests as told by your health care provider. You may have screening tests regularly, especially if you have certain risk factors for type 2 diabetes.  Make an appointment with a diet and nutrition specialist (registered dietitian). A registered dietitian can help you make a healthy eating plan and can help you understand portion sizes and food labels. Why are these changes important?  It is possible to prevent or delay type 2 diabetes and related health problems by making lifestyle and nutrition changes.  It can be difficult to recognize signs of type 2 diabetes. The best way to avoid possible damage to your body is to take actions to prevent the disease before you develop symptoms. What can happen if changes are not made?  Your blood glucose levels may keep increasing. Having high blood glucose for a long time is dangerous. Too much glucose in your blood can damage your blood vessels, heart, kidneys, nerves, and eyes.  You may develop prediabetes or type 2 diabetes. Type 2 diabetes can lead to many chronic health problems and complications, such  as: ? Heart disease. ? Stroke. ? Blindness. ? Kidney disease. ? Depression. ? Poor circulation in the feet and legs, which could lead to surgical removal (amputation) in severe cases. Where to find support  Ask your health care provider to recommend a registered dietitian, diabetes educator, or weight loss program.  Look for local or online weight loss groups.  Join a gym, fitness club, or outdoor activity group, such as a walking club. Where to find more information To learn more about diabetes and diabetes prevention, visit:  American Diabetes Association (ADA): www.diabetes.[employer]org  National Institute of Diabetes and Digestive and Kidney Diseases: ToyArticles.cawww.niddk.nih.gov/health-information/diabetes To learn more about healthy eating, visit:  The U.S. Department of Agriculture Architect(USDA), Choose My Plate: http://yates.biz/www.choosemyplate.gov/food-groups  Office of Disease Prevention and Health Promotion (ODPHP), Dietary Guidelines: ListingMagazine.siwww.health.gov/dietaryguidelines Summary  You can reduce your risk for type 2 diabetes by increasing your physical activity, eating healthy foods, and losing weight as directed.  Talk with your health care provider about your risk for type 2 diabetes. Ask about any blood tests or screening tests that you need to have. This information is not intended to replace advice given to you by your health care provider. Make sure you discuss any questions you have with your health care provider. Document Released: 11/23/2015 Document Revised: 07/13/2017 Document Reviewed: 09/22/2015 Elsevier Interactive Patient Education  2019 [employer].

## 2018-09-21 NOTE — Assessment & Plan Note (Signed)
Overall infrequent. Continue to monitor.

## 2018-09-21 NOTE — Assessment & Plan Note (Signed)
Stable in the office today, continue HCTZ 25 mg. BMP unremarkable.

## 2018-09-21 NOTE — Assessment & Plan Note (Signed)
Endorses tetanus vaccination is UTD, declines influenza vaccination. Pap smear and mammogram are UTD. Recommended to work on a healthy diet, get regular exercise. Exam unremarkable. Labs reviewed. Follow up in 1 year for CPE.

## 2018-09-21 NOTE — Addendum Note (Signed)
Addended by: Alvina Chou on: 09/21/2018 10:43 AM   Modules accepted: Orders

## 2018-09-21 NOTE — Progress Notes (Signed)
Subjective:    Patient ID: [name], female    DOB: February 26, 1976, 43 y.o.   MRN: [id number]  HPI  [name] is a 43 year old female who presents today for complete physical.  Immunizations: -Tetanus: Believes she is UTD -Influenza: Declines    Diet: She endorses a fair diet Breakfast: Crackers, grits, bagel with peanut butter Lunch: Take out food, vegetable, meat starch Dinner: Soup Snacks: Nuts, fruit, candy  Desserts: Three days weekly  Beverages: Coffee, water, soda, juice, aloe water  Exercise: She is not exercising  Eye exam: Completed years ago Dental exam: Completes annually  Pap Smear: Completed in January 2020, normal. Following with GYN. Mammogram: Completed at GYN office last week.  BP Readings from Last 3 Encounters:  09/21/18 128/86  08/19/18 (!) 167/108  04/04/18 (!) 144/84      Review of Systems  Constitutional: Negative for unexpected weight change.  HENT: Negative for rhinorrhea.   Respiratory: Negative for cough and shortness of breath.   Cardiovascular: Positive for palpitations. Negative for chest pain.       Experiences palpitations every other night on average x one year. Only when laying down. Denies chest pain, esophageal reflux, excessive caffeine use, and is not eating within 2 hours of bedtime. No symptoms during the day. No anxiety.  Gastrointestinal: Negative for constipation and diarrhea.  Genitourinary: Negative for difficulty urinating.  Musculoskeletal: Negative for arthralgias and myalgias.  Skin: Negative for rash.  Allergic/Immunologic: Negative for environmental allergies.  Neurological: Negative for dizziness, numbness and headaches.  Psychiatric/Behavioral: The patient is not nervous/anxious.        Past Medical History:  Diagnosis Date  . Diverticulitis    November 2018  . Hypertension      Social History   Socioeconomic History  . Marital status: Single    Spouse name: Not on file  . Number of children: Not on  file  . Years of education: Not on file  . Highest education level: Not on file  Occupational History  . Not on file  Social Needs  . Financial resource strain: Not on file  . Food insecurity:    Worry: Not on file    Inability: Not on file  . Transportation needs:    Medical: Not on file    Non-medical: Not on file  Tobacco Use  . Smoking status: Never Smoker  . Smokeless tobacco: Never Used  Substance and Sexual Activity  . Alcohol use: No  . Drug use: No  . Sexual activity: Not on file  Lifestyle  . Physical activity:    Days per week: Not on file    Minutes per session: Not on file  . Stress: Not on file  Relationships  . Social connections:    Talks on phone: Not on file    Gets together: Not on file    Attends religious service: Not on file    Active member of club or organization: Not on file    Attends meetings of clubs or organizations: Not on file    Relationship status: Not on file  . Intimate partner violence:    Fear of current or ex partner: Not on file    Emotionally abused: Not on file    Physically abused: Not on file    Forced sexual activity: Not on file  Other Topics Concern  . Not on file  Social History Narrative   Single but soon to be engaged.   Twin girls.  Works in Data processing manager through [employer]    Past Surgical History:  Procedure Laterality Date  . ABDOMINAL HYSTERECTOMY     Partial   . APPENDECTOMY    . CHOLECYSTECTOMY    . SMALL INTESTINE SURGERY     November 2018    Family History  Problem Relation Age of Onset  . Diverticulitis Mother   . Hypertension Mother   . Diabetes Father   . CAD Father   . Hypertension Father   . Eczema Father   . Anxiety disorder Father   . Diverticulitis Sister   . Hypertension Sister   . Breast cancer Maternal Aunt     No Known Allergies  Current Outpatient Medications on File Prior to Visit  Medication Sig Dispense Refill  . aspirin-acetaminophen-caffeine  (EXCEDRIN MIGRAINE) 250-250-65 MG tablet Take 2 tablets every 6 (six) hours as needed by mouth for headache or migraine.    . hydrochlorothiazide (HYDRODIURIL) 25 MG tablet Take 1 tablet (25 mg total) by mouth daily. 30 tablet 11  . UNABLE TO FIND Black Seed Oil (black cumin seed): Take 2 capsules (1,000 mg total) 3-4 times weekly     No current facility-administered medications on file prior to visit.     BP 128/86   Pulse 78   Temp 98.2 F (36.8 C) (Oral)   Ht 5' 10.25" (1.784 m)   Wt 273 lb 8 oz (124.1 kg)   SpO2 98%   BMI 38.96 kg/m    Objective:   Physical Exam  Constitutional: She is oriented to person, place, and time. She appears well-nourished.  HENT:  Mouth/Throat: No oropharyngeal exudate.  Eyes: Pupils are equal, round, and reactive to light. EOM are normal.  Neck: Neck supple. No thyromegaly present.  Cardiovascular: Normal rate and regular rhythm.  Respiratory: Effort normal and breath sounds normal.  GI: Soft. Bowel sounds are normal. There is no abdominal tenderness.  Musculoskeletal: Normal range of motion.  Neurological: She is alert and oriented to person, place, and time.  Skin: Skin is warm and dry.  Psychiatric: She has a normal mood and affect.           Assessment & Plan:

## 2018-09-21 NOTE — Assessment & Plan Note (Signed)
Recent A1C of 6.3, no other A1C to compare. Strongly advised she work on weight loss through diet and exercise. Repeat in 3 months.

## 2018-09-21 NOTE — Assessment & Plan Note (Signed)
Occur most days out of the week x 1 year. Exam today unremarkable. Check labs today including CBC and TSH, other labs reviewed. ECG today with NSR rate of 97. No T-wave inversion, ST changes.  Consider Holter Monitor evaluation vs cardiology evaluation given co-morbidities. We will also have her monitor BP more regularly given documentation of elevated readings.

## 2018-09-27 ENCOUNTER — Encounter: Payer: Self-pay | Admitting: Primary Care

## 2018-09-27 ENCOUNTER — Ambulatory Visit: Payer: BC Managed Care – PPO

## 2018-10-10 ENCOUNTER — Ambulatory Visit: Payer: BC Managed Care – PPO | Admitting: Primary Care

## 2018-12-19 ENCOUNTER — Other Ambulatory Visit: Payer: BC Managed Care – PPO

## 2018-12-19 ENCOUNTER — Other Ambulatory Visit: Payer: Self-pay

## 2019-01-28 ENCOUNTER — Emergency Department (HOSPITAL_COMMUNITY): Payer: BC Managed Care – PPO

## 2019-01-28 ENCOUNTER — Encounter (HOSPITAL_COMMUNITY): Payer: Self-pay | Admitting: Emergency Medicine

## 2019-01-28 ENCOUNTER — Emergency Department (HOSPITAL_COMMUNITY)
Admission: EM | Admit: 2019-01-28 | Discharge: 2019-01-28 | Disposition: A | Discharge status: Home / Self Care | Payer: BC Managed Care – PPO | Attending: Emergency Medicine | Admitting: Emergency Medicine

## 2019-01-28 DIAGNOSIS — K5792 Diverticulitis of intestine, part unspecified, without perforation or abscess without bleeding: Secondary | ICD-10-CM | POA: Insufficient documentation

## 2019-01-28 DIAGNOSIS — R1032 Left lower quadrant pain: Secondary | ICD-10-CM | POA: Diagnosis present

## 2019-01-28 DIAGNOSIS — Z79899 Other long term (current) drug therapy: Secondary | ICD-10-CM | POA: Insufficient documentation

## 2019-01-28 DIAGNOSIS — I10 Essential (primary) hypertension: Secondary | ICD-10-CM | POA: Insufficient documentation

## 2019-01-28 LAB — CBC WITH DIFFERENTIAL/PLATELET
Abs Immature Granulocytes: 0.02 10*3/uL (ref 0.00–0.07)
Basophils Absolute: 0 10*3/uL (ref 0.0–0.1)
Basophils Relative: 0 %
Eosinophils Absolute: 0.1 10*3/uL (ref 0.0–0.5)
Eosinophils Relative: 1 %
HCT: 37.5 % (ref 36.0–46.0)
Hemoglobin: 12.7 g/dL (ref 12.0–15.0)
Immature Granulocytes: 0 %
Lymphocytes Relative: 29 %
Lymphs Abs: 2.6 10*3/uL (ref 0.7–4.0)
MCH: 31.7 pg (ref 26.0–34.0)
MCHC: 33.9 g/dL (ref 30.0–36.0)
MCV: 93.5 fL (ref 80.0–100.0)
Monocytes Absolute: 0.6 10*3/uL (ref 0.1–1.0)
Monocytes Relative: 6 %
Neutro Abs: 5.8 10*3/uL (ref 1.7–7.7)
Neutrophils Relative %: 64 %
Platelets: 290 10*3/uL (ref 150–400)
RBC: 4.01 MIL/uL (ref 3.87–5.11)
RDW: 13.2 % (ref 11.5–15.5)
WBC: 9.2 10*3/uL (ref 4.0–10.5)
nRBC: 0 % (ref 0.0–0.2)

## 2019-01-28 LAB — COMPREHENSIVE METABOLIC PANEL
ALT: 26 U/L (ref 0–44)
AST: 29 U/L (ref 15–41)
Albumin: 3.8 g/dL (ref 3.5–5.0)
Alkaline Phosphatase: 25 U/L — ABNORMAL LOW (ref 38–126)
Anion gap: 11 (ref 5–15)
BUN: 15 mg/dL (ref 6–20)
CO2: 27 mmol/L (ref 22–32)
Calcium: 9.5 mg/dL (ref 8.9–10.3)
Chloride: 99 mmol/L (ref 98–111)
Creatinine, Ser: 1.18 mg/dL — ABNORMAL HIGH (ref 0.44–1.00)
GFR calc Af Amer: >60 mL/min (ref >60)
GFR calc non Af Amer: 57 mL/min — ABNORMAL LOW (ref >60)
Glucose, Bld: 132 mg/dL — ABNORMAL HIGH (ref 70–99)
Potassium: 3.1 mmol/L — ABNORMAL LOW (ref 3.5–5.1)
Sodium: 137 mmol/L (ref 135–145)
Total Bilirubin: 0.8 mg/dL (ref 0.3–1.2)
Total Protein: 9.3 g/dL — ABNORMAL HIGH (ref 6.5–8.1)

## 2019-01-28 LAB — I-STAT BETA HCG BLOOD, ED (MC, WL, AP ONLY): I-stat hCG, quantitative: <5 m[IU]/mL (ref <5)

## 2019-01-28 LAB — LIPASE, BLOOD: Lipase: 30 U/L (ref 11–51)

## 2019-01-28 MED ORDER — OXYCODONE-ACETAMINOPHEN 5-325 MG PO TABS: 1.0000 | ORAL_TABLET | ORAL | 0 refills | Status: DC | PRN

## 2019-01-28 MED ORDER — CIPROFLOXACIN HCL 500 MG PO TABS: 500.0000 mg | ORAL_TABLET | Freq: Two times a day (BID) | ORAL | 0 refills | Status: DC

## 2019-01-28 MED ORDER — HYDROMORPHONE HCL 1 MG/ML IJ SOLN
1.0000 mg | Freq: Once | INTRAMUSCULAR | Status: AC
  Administered 2019-01-28: 1 mg via INTRAVENOUS
  Filled 2019-01-28: qty 1

## 2019-01-28 MED ORDER — SODIUM CHLORIDE 0.9 % IV SOLN
INTRAVENOUS | Status: DC
  Administered 2019-01-28: 19:00:00 via INTRAVENOUS

## 2019-01-28 MED ORDER — ONDANSETRON HCL 4 MG PO TABS: 4.0000 mg | ORAL_TABLET | Freq: Four times a day (QID) | ORAL | 0 refills | Status: DC

## 2019-01-28 MED ORDER — KETOROLAC TROMETHAMINE 15 MG/ML IJ SOLN
15.0000 mg | Freq: Once | INTRAMUSCULAR | Status: AC
  Administered 2019-01-28: 19:00:00 15 mg via INTRAVENOUS
  Filled 2019-01-28: qty 1

## 2019-01-28 MED ORDER — HYDROMORPHONE HCL 1 MG/ML IJ SOLN
1.0000 mg | Freq: Once | INTRAMUSCULAR | Status: AC
  Administered 2019-01-28: 18:00:00 1 mg via INTRAVENOUS
  Filled 2019-01-28: qty 1

## 2019-01-28 MED ORDER — METRONIDAZOLE 500 MG PO TABS: 500.0000 mg | ORAL_TABLET | Freq: Two times a day (BID) | ORAL | 0 refills | Status: DC

## 2019-01-28 MED ORDER — IOHEXOL 300 MG/ML  SOLN
100.0000 mL | Freq: Once | INTRAMUSCULAR | Status: AC | PRN
  Administered 2019-01-28: 100 mL via INTRAVENOUS

## 2019-01-28 MED ORDER — METRONIDAZOLE IN NACL 5-0.79 MG/ML-% IV SOLN
500.0000 mg | Freq: Once | INTRAVENOUS | Status: AC
  Administered 2019-01-28: 19:00:00 500 mg via INTRAVENOUS
  Filled 2019-01-28: qty 100

## 2019-01-28 MED ORDER — ONDANSETRON HCL 4 MG/2ML IJ SOLN
4.0000 mg | Freq: Once | INTRAMUSCULAR | Status: AC
  Administered 2019-01-28: 4 mg via INTRAVENOUS
  Filled 2019-01-28: qty 2

## 2019-01-28 MED ORDER — CIPROFLOXACIN HCL 500 MG PO TABS
500.0000 mg | ORAL_TABLET | Freq: Once | ORAL | Status: AC
  Administered 2019-01-28: 500 mg via ORAL
  Filled 2019-01-28: qty 1

## 2019-01-28 NOTE — ED Notes (Signed)
Patient transported to CT 

## 2019-01-28 NOTE — ED Triage Notes (Signed)
Hx of diverticulitis. She states she has been hospitalized in past. Hx of surgeries and intestinal removal and such. No ostomy. 2 days of pain starting Saturday getting worse on LLQ. Denies vomiting. Has had chills and nausea.

## 2019-01-28 NOTE — ED Provider Notes (Signed)
[facility] Provider Note   CSN: [id number] Arrival date & time: 01/28/19  1426     History   Chief Complaint Chief Complaint  Patient presents with   Diverticulitis    HPI Linda Ramaboni C Armstrong is a 43 y.o. female.     HPI   43 year old female with abdominal pain.  Onset Saturday.  Persistent and progressive since then.  Pain is in the left lower quadrant.  Waxes and wanes.  Very severe to the point of doubling her over at times.  She has a past history of diverticulitis and reports current symptoms feel similar. Subjective fever.  No change in her bowel movements.  Nauseated, but no vomiting.  No urinary complaints.  She is tried NSAIDs without much improvement.  Past Medical History:  Diagnosis Date   Diverticulitis    November 2018   Hypertension     Patient Active Problem List   Diagnosis Date Noted   Prediabetes 09/21/2018   Palpitations 09/21/2018   Preventative health care 09/21/2018   Pedal edema 04/04/2018   Migraines 09/07/2017   Essential hypertension 09/07/2017    Past Surgical History:  Procedure Laterality Date   ABDOMINAL HYSTERECTOMY     Partial    APPENDECTOMY     CHOLECYSTECTOMY     SMALL INTESTINE SURGERY     November 2018     OB History   No obstetric history on file.      Home Medications    Prior to Admission medications   Medication Sig Start Date End Date Taking? Authorizing Provider  aspirin-acetaminophen-caffeine (EXCEDRIN MIGRAINE) [phone] MG tablet Take 2 tablets every 6 (six) hours as needed by mouth for headache or migraine.    [provider]  hydrochlorothiazide (HYDRODIURIL) 25 MG tablet Take 1 tablet (25 mg total) by mouth daily. 04/04/18   Tower, Audrie GallusMarne A, MD  UNABLE TO FIND Black Seed Oil (black cumin seed): Take 2 capsules (1,000 mg total) 3-4 times weekly    [provider]    Family History Family History  Problem Relation Age of Onset    Diverticulitis Mother    Hypertension Mother    Diabetes Father    CAD Father    Hypertension Father    Eczema Father    Anxiety disorder Father    Diverticulitis Sister    Hypertension Sister    Breast cancer Maternal Aunt     Social History Social History   Tobacco Use   Smoking status: Never Smoker   Smokeless tobacco: Never Used  Substance Use Topics   Alcohol use: No   Drug use: No     Allergies   Patient has no known allergies.   Review of Systems Review of Systems  All systems reviewed and negative, other than as noted in HPI.  Physical Exam Updated Vital Signs BP (!) 147/91 (BP Location: Left Wrist)    Pulse 88    Temp 98.5 F (36.9 C) (Oral)    Resp 17    SpO2 100%   Physical Exam Vitals signs and nursing note reviewed.  Constitutional:      General: She is not in acute distress.    Appearance: She is well-developed.  HENT:     Head: Normocephalic and atraumatic.  Eyes:     General:        Right eye: No discharge.        Left eye: No discharge.     Conjunctiva/sclera: Conjunctivae normal.  Neck:  Musculoskeletal: Neck supple.  Cardiovascular:     Rate and Rhythm: Normal rate and regular rhythm.     Heart sounds: Normal heart sounds. No murmur. No friction rub. No gallop.   Pulmonary:     Effort: Pulmonary effort is normal. No respiratory distress.     Breath sounds: Normal breath sounds.  Abdominal:     General: There is no distension.     Palpations: Abdomen is soft.     Tenderness: There is abdominal tenderness.     Comments: Tenderness in left lower quadrant without rebound or guarding.  No distention.  Obese abdomen.  Musculoskeletal:        General: No tenderness.  Skin:    General: Skin is warm and dry.  Neurological:     Mental Status: She is alert.  Psychiatric:        Behavior: Behavior normal.        Thought Content: Thought content normal.      ED Treatments / Results  Labs (all labs ordered are listed,  but only abnormal results are displayed) Labs Reviewed  COMPREHENSIVE METABOLIC PANEL - Abnormal; Notable for the following components:      Result Value   Potassium 3.1 (*)    Glucose, Bld 132 (*)    Creatinine, Ser 1.18 (*)    Total Protein 9.3 (*)    Alkaline Phosphatase 25 (*)    GFR calc non Af Amer 57 (*)    All other components within normal limits  CBC WITH DIFFERENTIAL/PLATELET  LIPASE, BLOOD  I-STAT BETA HCG BLOOD, ED ([facility], [facility], [facility] ONLY)    EKG    Radiology Ct Abdomen Pelvis W Contrast  Result Date: 01/28/2019 CLINICAL DATA:  Abdominal pain with diverticulitis suspected EXAM: CT ABDOMEN AND PELVIS WITH CONTRAST TECHNIQUE: Multidetector CT imaging of the abdomen and pelvis was performed using the standard protocol following bolus administration of intravenous contrast. CONTRAST:  138mL OMNIPAQUE IOHEXOL 300 MG/ML  SOLN COMPARISON:  06/25/2017 FINDINGS: Lower chest: No acute abnormality. Hepatobiliary: There is diffuse hepatic steatosis. The patient is status post prior cholecystectomy. Pancreas: Unremarkable. No pancreatic ductal dilatation or surrounding inflammatory changes. Spleen: Normal in size without focal abnormality. Adrenals/Urinary Tract: The adrenal glands are unremarkable. There is no hydronephrosis. There are no radiopaque obstructing kidney stones. The bladder is moderately distended. Stomach/Bowel: The patient is status post prior partial colectomy with an anastomosis in the left lower quadrant. There is evidence of acute diverticulitis involving the distal descending colon. There are scattered colonic diverticula throughout the remaining portions of the colon. The appendix is not reliably identified, however there are no significant inflammatory changes in the right lower quadrant. The stomach is unremarkable. There is no evidence of a small-bowel obstruction. Vascular/Lymphatic: No evidence of abdominal aortic aneurysm. There are stable prominent right common iliac  chain lymph nodes measuring up to approximately 1.4 cm. Reproductive: Status post hysterectomy. No adnexal masses. Other: There is a small fat containing periumbilical hernia. Musculoskeletal: No acute or significant osseous findings. IMPRESSION: 1. Acute uncomplicated diverticulitis involving the distal descending colon. The patient appears to be status post prior sigmoid colectomy. 2. Hepatic steatosis. The patient is status post prior cholecystectomy. 3. Moderately distended urinary bladder. Electronically Signed   By: Constance Holster M.D.   On: 01/28/2019 18:26    Procedures Procedures (including critical care time)  Medications Ordered in ED Medications  HYDROmorphone (DILAUDID) injection 1 mg (has no administration in time range)  0.9 %  sodium chloride infusion (has  no administration in time range)  ondansetron (ZOFRAN) injection 4 mg (has no administration in time range)     Initial Impression / Assessment and Plan / ED Course  I have reviewed the triage vital signs and the nursing notes.  Pertinent labs & imaging results that were available during my care of the patient were reviewed by me and considered in my medical decision making (see chart for details).  43 year old female with left lower quadrant pain.  Clinically I suspect this is diverticulitis.  She is seen in urgent care prior to arrival and referred to the emergency room for a CT scan.  I think this is reasonable.  We will treat her symptoms in the meantime.  Uncomplicated diverticulitis. Abx. Prn meds. Return precautions discussed.  Final Clinical Impressions(s) / ED Diagnoses   Final diagnoses:  Diverticulitis    ED Discharge Orders    None       Raeford RazorKohut, Kyion Gautier, MD 01/29/19 1515

## 2019-01-29 ENCOUNTER — Telehealth: Payer: Self-pay | Admitting: *Deleted

## 2019-01-29 NOTE — Telephone Encounter (Signed)
Please notify patient that I'm always happy to help however I can. My goal is always to prevent emergency department visits if we can handle something in the office.   As of yet I have not received her Covid-19 test results.

## 2019-01-29 NOTE — Telephone Encounter (Signed)
Patient called wanting to make sure that Allie Bossier NP saw her hospital visit from  yesterday. Patient wants to know that in the future if she has a flare-up she will not have to go back to the hospital and the problem can be taken care of at the office.  Advised patient that if she has another problem to call the office and a visit can be set up for her and she verbalized understanding. Patient stated that she had a covid test done at a place that was offering free testing and wondered if Anda Kraft would be getting the results? Patient stated that her pediatricians office got her daughter's results.

## 2019-01-30 NOTE — Telephone Encounter (Signed)
Spoken and notified patient of Kate Clark's comments. Patient verbalized understanding.  

## 2019-02-01 ENCOUNTER — Telehealth: Payer: Self-pay | Admitting: *Deleted

## 2019-02-01 DIAGNOSIS — K5792 Diverticulitis of intestine, part unspecified, without perforation or abscess without bleeding: Secondary | ICD-10-CM

## 2019-02-01 MED ORDER — PROMETHAZINE HCL 12.5 MG PO TABS: 12.5000 mg | ORAL_TABLET | Freq: Three times a day (TID) | ORAL | 0 refills | Status: DC | PRN

## 2019-02-01 NOTE — Telephone Encounter (Signed)
Copied from [employer] [id number]. Topic: General - Other >> Jan 31, 2019  4:58 PM Mcneil, Ja-Kwan wrote: Reason for CRM: Pt stated the ondansetron (ZOFRAN) 4 MG tablet is not working and she would like to request that another medication be prescribed for the nausea. Pt requests call back. Cb# [phone]  [id number] - [place], [place] [address]

## 2019-02-01 NOTE — Telephone Encounter (Signed)
Noted  

## 2019-02-01 NOTE — Telephone Encounter (Signed)
Spoken and notified patient of [name] comments. Patient stated it is intermittently but better. Yes she is taking the antibiotics as prescribed.

## 2019-02-01 NOTE — Telephone Encounter (Signed)
Please notify patient that I'll send in a medication called promethazine for nausea. This may cause drowsiness. How is she doing with her pain and other symptoms? Is she taking the antibiotics?

## 2019-02-27 ENCOUNTER — Other Ambulatory Visit: Payer: Self-pay | Admitting: *Deleted

## 2019-02-27 MED ORDER — HYDROCHLOROTHIAZIDE 25 MG PO TABS: 25.0000 mg | ORAL_TABLET | Freq: Every day | ORAL | 1 refills | Status: DC

## 2019-08-21 ENCOUNTER — Telehealth: Payer: Self-pay | Admitting: Primary Care

## 2019-08-21 ENCOUNTER — Other Ambulatory Visit: Payer: Self-pay | Admitting: Primary Care

## 2019-08-21 NOTE — Telephone Encounter (Signed)
Place form/paperwork in [place] Clark's inbox for review and complete if necessary.   Patient has appointment for cpe on 09/25/2019

## 2019-08-21 NOTE — Telephone Encounter (Signed)
When does she need the form back? Before her visit or can we wait until her visit?

## 2019-08-21 NOTE — Telephone Encounter (Signed)
Spoken to patient and she stated that she need this completed by next week

## 2019-08-21 NOTE — Telephone Encounter (Signed)
Completed and placed in Chans inbox. 

## 2019-08-21 NOTE — Telephone Encounter (Signed)
Pt dropped off form to be filled out. Placed in [facility] tower. °

## 2019-08-22 NOTE — Telephone Encounter (Signed)
Patient have been notified and place form in the front office.

## 2019-09-15 ENCOUNTER — Other Ambulatory Visit: Payer: Self-pay | Admitting: Primary Care

## 2019-09-15 DIAGNOSIS — R7303 Prediabetes: Secondary | ICD-10-CM

## 2019-09-15 DIAGNOSIS — I10 Essential (primary) hypertension: Secondary | ICD-10-CM

## 2019-09-23 ENCOUNTER — Other Ambulatory Visit: Payer: BC Managed Care – PPO

## 2019-09-25 ENCOUNTER — Encounter: Payer: BC Managed Care – PPO | Admitting: Primary Care

## 2019-10-11 ENCOUNTER — Encounter (HOSPITAL_COMMUNITY): Payer: Self-pay

## 2019-10-11 ENCOUNTER — Other Ambulatory Visit: Payer: Self-pay

## 2019-10-11 ENCOUNTER — Ambulatory Visit (HOSPITAL_COMMUNITY)
Admission: EM | Admit: 2019-10-11 | Discharge: 2019-10-11 | Disposition: A | Discharge status: Home / Self Care | Payer: BC Managed Care – PPO

## 2019-10-11 DIAGNOSIS — R350 Frequency of micturition: Secondary | ICD-10-CM | POA: Insufficient documentation

## 2019-10-11 DIAGNOSIS — R3915 Urgency of urination: Secondary | ICD-10-CM | POA: Insufficient documentation

## 2019-10-11 DIAGNOSIS — R319 Hematuria, unspecified: Secondary | ICD-10-CM | POA: Insufficient documentation

## 2019-10-11 LAB — POCT URINALYSIS DIP (DEVICE)
Bilirubin Urine: NEGATIVE
Glucose, UA: NEGATIVE mg/dL
Ketones, ur: NEGATIVE mg/dL
Nitrite: NEGATIVE
Protein, ur: >=300 mg/dL — AB
Specific Gravity, Urine: 1.02 (ref 1.005–1.030)
Urobilinogen, UA: 0.2 mg/dL (ref 0.0–1.0)
pH: 6.5 (ref 5.0–8.0)

## 2019-10-11 MED ORDER — NITROFURANTOIN MONOHYD MACRO 100 MG PO CAPS: 100.0000 mg | ORAL_CAPSULE | Freq: Two times a day (BID) | ORAL | 0 refills | Status: AC

## 2019-10-11 NOTE — ED Triage Notes (Signed)
Pt is here with the urgency to urinate, odor & the discharge she has is blood when wiping. Pt has not taken any meds to relieve discomfort.

## 2019-10-11 NOTE — ED Provider Notes (Signed)
[facility]    CSN: [id number] Arrival date & time: 10/11/19  1117      History   Chief Complaint Chief Complaint  Patient presents with  . Urinary Tract Infection    HPI [name] is a 44 y.o. female.   Reports increased urinary frequency for the last 3 days.  Reports that she sees some pink when she is wiping after urinating.  Reports that she is not been sexually active for the last 3 years, denies the need for STD testing today.  Reports that she feels like her urine has a different smell than it usually does.  Reports that she took some leftover amoxicillin, with no relief.  Reports that she takes lots of vitamins and immune support holistic supplements.  Denies fever, headache, muscle aches, sore throat, nausea, diarrhea, vomiting, rash, other symptoms.  ROS per HPI  The history is provided by the patient.    Past Medical History:  Diagnosis Date  . Diverticulitis    November 2018  . Hypertension     Patient Active Problem List   Diagnosis Date Noted  . Prediabetes 09/21/2018  . Palpitations 09/21/2018  . Preventative health care 09/21/2018  . Pedal edema 04/04/2018  . Migraines 09/07/2017  . Essential hypertension 09/07/2017    Past Surgical History:  Procedure Laterality Date  . ABDOMINAL HYSTERECTOMY     Partial   . APPENDECTOMY    . CHOLECYSTECTOMY    . SMALL INTESTINE SURGERY     November 2018    OB History   No obstetric history on file.      Home Medications    Prior to Admission medications   Medication Sig Start Date End Date Taking? Authorizing Provider  hydrochlorothiazide (HYDRODIURIL) 25 MG tablet  11/21/18  Yes [provider]  aspirin-acetaminophen-caffeine (EXCEDRIN MIGRAINE) [phone] MG tablet Take 2 tablets every 6 (six) hours as needed by mouth for headache or migraine.    [provider]  ciprofloxacin (CIPRO) 500 MG tablet Take 1 tablet (500 mg total) by mouth every 12 (twelve) hours. 01/28/19    Raeford Razor, MD  hydrochlorothiazide (HYDRODIURIL) 25 MG tablet TAKE 1 TABLET BY MOUTH EVERY DAY 08/21/19   [name], NP  metroNIDAZOLE (FLAGYL) 500 MG tablet Take 1 tablet (500 mg total) by mouth 2 (two) times daily. 01/28/19   Raeford Razor, MD  nitrofurantoin, macrocrystal-monohydrate, (MACROBID) 100 MG capsule Take 1 capsule (100 mg total) by mouth 2 (two) times daily for 5 days. 10/11/19 10/16/19  Moshe Cipro, NP  ondansetron (ZOFRAN) 4 MG tablet Take 1 tablet (4 mg total) by mouth every 6 (six) hours. 01/28/19   Raeford Razor, MD  oxyCODONE-acetaminophen (PERCOCET/ROXICET) 5-325 MG tablet Take 1 tablet by mouth every 4 (four) hours as needed for severe pain. 01/28/19   Raeford Razor, MD  promethazine (PHENERGAN) 12.5 MG tablet Take 1 tablet (12.5 mg total) by mouth every 8 (eight) hours as needed for nausea or vomiting. 02/01/19   [name], NP  UNABLE TO FIND Black Seed Oil (black cumin seed): Take 2 capsules (1,000 mg total) 3-4 times weekly    [provider]    Family History Family History  Problem Relation Age of Onset  . Diverticulitis Mother   . Hypertension Mother   . Diabetes Father   . CAD Father   . Hypertension Father   . Eczema Father   . Anxiety disorder Father   . Diverticulitis Sister   . Hypertension Sister   .  Breast cancer Maternal Aunt     Social History Social History   Tobacco Use  . Smoking status: Never Smoker  . Smokeless tobacco: Never Used  Substance Use Topics  . Alcohol use: No  . Drug use: No     Allergies   Patient has no known allergies.   Review of Systems Review of Systems   Physical Exam Triage Vital Signs ED Triage Vitals  Enc Vitals Group     BP      Pulse      Resp      Temp      Temp src      SpO2      Weight      Height      Head Circumference      Peak Flow      Pain Score      Pain Loc      Pain Edu?      Excl. in [place]?    No data found.  Updated Vital Signs BP (!) 146/97  (BP Location: Right Arm)   Pulse 95   Temp 98.4 F (36.9 C) (Oral)   Resp 19   Wt 278 lb 6.4 oz (126.3 kg)   SpO2 98%   BMI 39.66 kg/m      Physical Exam Vitals and nursing note reviewed.  Constitutional:      General: She is not in acute distress.    Appearance: She is well-developed.  HENT:     Head: Normocephalic and atraumatic.     Nose: Nose normal.     Mouth/Throat:     Mouth: Mucous membranes are moist.  Eyes:     Conjunctiva/sclera: Conjunctivae normal.  Cardiovascular:     Rate and Rhythm: Normal rate and regular rhythm.     Heart sounds: Normal heart sounds. No murmur.  Pulmonary:     Effort: Pulmonary effort is normal. No respiratory distress.     Breath sounds: Normal breath sounds.  Abdominal:     General: Bowel sounds are normal. There is no distension.     Palpations: Abdomen is soft. There is no mass.     Tenderness: There is no abdominal tenderness. There is no guarding.     Hernia: No hernia is present.  Musculoskeletal:        General: Normal range of motion.     Cervical back: Neck supple.  Skin:    General: Skin is warm and dry.     Capillary Refill: Capillary refill takes less than 2 seconds.  Neurological:     General: No focal deficit present.     Mental Status: She is alert and oriented to person, place, and time.  Psychiatric:        Mood and Affect: Mood normal.        Behavior: Behavior normal.      UC Treatments / Results  Labs (all labs ordered are listed, but only abnormal results are displayed) Labs Reviewed  POCT URINALYSIS DIP (DEVICE) - Abnormal; Notable for the following components:      Result Value   Hgb urine dipstick LARGE (*)    Protein, ur >=300 (*)    Leukocytes,Ua SMALL (*)    All other components within normal limits  URINE CULTURE    EKG   Radiology No results found.  Procedures Procedures (including critical care time)  Medications Ordered in UC Medications - No data to display  Initial  Impression / Assessment and Plan / UC  Course  I have reviewed the triage vital signs and the nursing notes.  Pertinent labs & imaging results that were available during my care of the patient were reviewed by me and considered in my medical decision making (see chart for details).     Urinary frequency and urgency.  Abdomen nontender on palpation.  UA in office shows small amounts of leukocytes and small amount of blood.  Will treat with Macrobid 100 mg twice daily x5 days.  Will culture urine, and call patient with results.  Will change treatment if indicated.  Instructed to follow-up with primary care provider if she is not feeling better by Monday. Final Clinical Impressions(s) / UC Diagnoses   Final diagnoses:  Urinary frequency  Urinary urgency  Hematuria, unspecified type     Discharge Instructions     You have white blood cells and blood in your urine. I have sent in an antibiotic for you to take twice a day for 5 days.  We will culture your urine, and will call you with the results.   Drink plenty of fluids. Follow up with primary care as needed.     ED Prescriptions    Medication Sig Dispense Auth. Provider   nitrofurantoin, macrocrystal-monohydrate, (MACROBID) 100 MG capsule Take 1 capsule (100 mg total) by mouth 2 (two) times daily for 5 days. 10 capsule Moshe Cipro, NP     I have reviewed the PDMP during this encounter.   Moshe Cipro, NP 10/11/19 1433

## 2019-10-11 NOTE — Discharge Instructions (Addendum)
You have white blood cells and blood in your urine. I have sent in an antibiotic for you to take twice a day for 5 days.  We will culture your urine, and will call you with the results.   Drink plenty of fluids. Follow up with primary care as needed.

## 2019-10-12 LAB — URINE CULTURE: Culture: <10000 — AB

## 2019-11-06 ENCOUNTER — Encounter: Payer: BC Managed Care – PPO | Admitting: Primary Care

## 2019-11-06 ENCOUNTER — Other Ambulatory Visit: Payer: Self-pay

## 2019-12-06 ENCOUNTER — Encounter: Payer: Self-pay | Admitting: Primary Care

## 2019-12-06 DIAGNOSIS — Z0289 Encounter for other administrative examinations: Secondary | ICD-10-CM

## 2020-06-17 ENCOUNTER — Ambulatory Visit (HOSPITAL_COMMUNITY)
Admission: EM | Admit: 2020-06-17 | Discharge: 2020-06-17 | Disposition: A | Discharge status: Home / Self Care | Payer: BC Managed Care – PPO | Attending: Family Medicine | Admitting: Family Medicine

## 2020-06-17 ENCOUNTER — Encounter (HOSPITAL_COMMUNITY): Payer: Self-pay | Admitting: Emergency Medicine

## 2020-06-17 ENCOUNTER — Other Ambulatory Visit: Payer: Self-pay

## 2020-06-17 DIAGNOSIS — Z20822 Contact with and (suspected) exposure to covid-19: Secondary | ICD-10-CM | POA: Insufficient documentation

## 2020-06-17 DIAGNOSIS — R030 Elevated blood-pressure reading, without diagnosis of hypertension: Secondary | ICD-10-CM | POA: Diagnosis not present

## 2020-06-17 DIAGNOSIS — J069 Acute upper respiratory infection, unspecified: Secondary | ICD-10-CM | POA: Insufficient documentation

## 2020-06-17 NOTE — ED Triage Notes (Signed)
Patient started feeling bad yesterday.  Patient complains of congestion, drainage, stuffy nose Denies fever, no chills

## 2020-06-17 NOTE — Discharge Instructions (Addendum)
You have been tested for COVID-19 today. If your test returns positive, you will receive a phone call from [facility] regarding your results. Negative test results are not called. Both positive and negative results area always visible on MyChart. If you do not have a MyChart account, sign up instructions are provided in your discharge papers. Please do not hesitate to contact us should you have questions or concerns.  Your blood pressure was noted to be elevated during your visit today. If you are currently taking medication for high blood pressure, please ensure you are taking this as directed. If you do not have a history of high blood pressure and your blood pressure remains persistently elevated, you may need to begin taking a medication at some point. You may return here within the next few days to recheck if unable to see your primary care provider or if do not have a one.  BP (!) 187/123 (BP Location: Left Arm) Comment: large cuff, rechecked blood pressure (2nd reading) Comment (BP Location): large cuff  Pulse 94   Temp 98.6 F (37 C) (Oral)   Resp 20   SpO2 100%

## 2020-06-18 LAB — SARS CORONAVIRUS 2 (TAT 6-24 HRS): SARS Coronavirus 2: NEGATIVE

## 2020-06-20 NOTE — ED Provider Notes (Signed)
[name]   [id number] 06/17/20 Arrival Time: 1611  ASSESSMENT & PLAN:  1. Viral URI with cough   2. Elevated blood pressure reading without diagnosis of hypertension      COVID-19 testing sent. See letter/work note on file for self-isolation guidelines. OTC symptom care as needed.  No orders of the defined types were placed in this encounter.    Follow-up Information    Schedule an appointment as soon as possible for a visit  with Linda Nest, NP.   Specialty: Internal Medicine Why: To check your blood pressure. Contact information: [address] [place] [address] [phone]               Reviewed expectations re: course of current medical issues. Questions answered. Outlined signs and symptoms indicating need for more acute intervention. Understanding verbalized. After Visit Summary given.   SUBJECTIVE: History from: patient. [name] is a 44 y.o. female who presents with cold symptoms beginning yesterday; nasal congestion, post-nasal drainage. Afebrile. Denies: cough. Normal PO intake without n/v/d.    OBJECTIVE:  Vitals:   06/17/20 1750  BP: (!) 187/123  Pulse: 94  Resp: 20  Temp: 98.6 F (37 C)  TempSrc: Oral  SpO2: 100%    General appearance: alert; no distress Eyes: PERRLA; EOMI; conjunctiva normal HENT: [place]; AT; with nasal congestion Neck: supple  Lungs: speaks full sentences without difficulty; unlabored Extremities: no edema Skin: warm and dry Neurologic: normal gait Psychological: alert and cooperative; normal mood and affect  Labs: Labs Reviewed  SARS CORONAVIRUS 2 (TAT 6-24 HRS)     No Known Allergies  Past Medical History:  Diagnosis Date  . Diverticulitis    November 2018  . Hypertension    Social History   Socioeconomic History  . Marital status: Single    Spouse name: Not on file  . Number of children: Not on file  . Years of education: Not on file  . Highest education level: Not  on file  Occupational History  . Not on file  Tobacco Use  . Smoking status: Never Smoker  . Smokeless tobacco: Never Used  Substance and Sexual Activity  . Alcohol use: No  . Drug use: No  . Sexual activity: Not Currently    Birth control/protection: Surgical  Other Topics Concern  . Not on file  Social History Narrative   Single but soon to be engaged.   Twin girls.   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 Difficulty of Paying Living Expenses: Not on file  Food Insecurity:   . Worried About Programme researcher, broadcasting/film/video in the Last Year: Not on file  . Ran Out of Food in the Last Year: Not on file  Transportation Needs:   . Lack of Transportation (Medical): Not on file  . Lack of Transportation (Non-Medical): Not on file  Physical Activity:   . Days of Exercise per Week: Not on file  . Minutes of Exercise per Session: Not on file  Stress:   . Feeling of Stress : Not on file  Social Connections:   . Frequency of Communication with Friends and Family: Not on file  . Frequency of Social Gatherings with Friends and Family: Not on file  . Attends Religious Services: Not on file  . Active Member of Clubs or Organizations: Not on file  . Attends Banker Meetings:  Not on file  . Marital Status: Not on file  Intimate Partner Violence:   . Fear of Current or Ex-Partner: Not on file  . Emotionally Abused: Not on file  . Physically Abused: Not on file  . Sexually Abused: Not on file   Family History  Problem Relation Age of Onset  . Diverticulitis Mother   . Hypertension Mother   . Diabetes Father   . CAD Father   . Hypertension Father   . Eczema Father   . Anxiety disorder Father   . Diverticulitis Sister   . Hypertension Sister   . Breast cancer Maternal Aunt    Past Surgical History:  Procedure Laterality Date  . ABDOMINAL HYSTERECTOMY     Partial   . APPENDECTOMY      . CHOLECYSTECTOMY    . SMALL INTESTINE SURGERY     November 2018     Linda Layman, MD 06/20/20 [phone]

## 2020-07-22 ENCOUNTER — Encounter: Payer: Self-pay | Admitting: Primary Care

## 2020-07-22 ENCOUNTER — Ambulatory Visit (INDEPENDENT_AMBULATORY_CARE_PROVIDER_SITE_OTHER): Payer: BC Managed Care – PPO | Admitting: Primary Care

## 2020-07-22 ENCOUNTER — Other Ambulatory Visit: Payer: Self-pay

## 2020-07-22 VITALS — BP 140/96 | HR 88 | Temp 97.6°F | Ht 70.5 in | Wt 284.0 lb

## 2020-07-22 DIAGNOSIS — G43701 Chronic migraine without aura, not intractable, with status migrainosus: Secondary | ICD-10-CM

## 2020-07-22 DIAGNOSIS — I10 Essential (primary) hypertension: Secondary | ICD-10-CM | POA: Diagnosis not present

## 2020-07-22 DIAGNOSIS — Z0001 Encounter for general adult medical examination with abnormal findings: Secondary | ICD-10-CM

## 2020-07-22 DIAGNOSIS — Z1231 Encounter for screening mammogram for malignant neoplasm of breast: Secondary | ICD-10-CM

## 2020-07-22 DIAGNOSIS — R600 Localized edema: Secondary | ICD-10-CM

## 2020-07-22 DIAGNOSIS — R7303 Prediabetes: Secondary | ICD-10-CM

## 2020-07-22 DIAGNOSIS — K219 Gastro-esophageal reflux disease without esophagitis: Secondary | ICD-10-CM | POA: Insufficient documentation

## 2020-07-22 MED ORDER — HYDROCHLOROTHIAZIDE 25 MG PO TABS: 25.0000 mg | ORAL_TABLET | Freq: Every day | ORAL | 3 refills | Status: DC

## 2020-07-22 MED ORDER — FAMOTIDINE 20 MG PO TABS: 20.0000 mg | ORAL_TABLET | Freq: Two times a day (BID) | ORAL | 0 refills | Status: DC

## 2020-07-22 MED ORDER — OLMESARTAN MEDOXOMIL 20 MG PO TABS: 20.0000 mg | ORAL_TABLET | Freq: Every day | ORAL | 0 refills | Status: DC

## 2020-07-22 NOTE — Patient Instructions (Addendum)
Stop by the lab prior to leaving today. I will notify you of your results once received.   Start olmesartan 20 mg once daily for blood pressure. Continue hydrochlorothiazide 25 mg for blood pressure.  Start famotidine 20 mg at bedtime for heartburn. Okay to take twice daily or two tablets at bedtime.   [facility].   Start exercising. You should be getting 150 minutes of moderate intensity exercise weekly.  It's important to improve your diet by reducing consumption of fast food, fried food, processed snack foods, sugary drinks. Increase consumption of fresh vegetables and fruits, whole grains, water.  Ensure you are drinking 64 ounces of water daily.  Please schedule a follow up visit to meet back with me in 2-3 weeks for blood pressure check.   It was a pleasure to see you today!   Food Choices for Gastroesophageal Reflux Disease, Adult When you have gastroesophageal reflux disease (GERD), the foods you eat and your eating habits are very important. Choosing the right foods can help ease your discomfort. Think about working with a nutrition specialist (dietitian) to help you make good choices. What are tips for following this plan?  Meals  Choose healthy foods that are low in fat, such as fruits, vegetables, whole grains, low-fat dairy products, and lean meat, fish, and poultry.  Eat small meals often instead of 3 large meals a day. Eat your meals slowly, and in a place where you are relaxed. Avoid bending over or lying down until 2-3 hours after eating.  Avoid eating meals 2-3 hours before bed.  Avoid drinking a lot of liquid with meals.  Cook foods using methods other than frying. Bake, grill, or broil food instead.  Avoid or limit: ? Chocolate. ? Peppermint or spearmint. ? Alcohol. ? Pepper. ? Black and decaffeinated coffee. ? Black and decaffeinated tea. ? Bubbly (carbonated) soft drinks. ? Caffeinated energy drinks and soft  drinks.  Limit high-fat foods such as: ? Fatty meat or fried foods. ? Whole milk, cream, butter, or ice cream. ? Nuts and nut butters. ? Pastries, donuts, and sweets made with butter or shortening.  Avoid foods that cause symptoms. These foods may be different for everyone. Common foods that cause symptoms include: ? Tomatoes. ? Oranges, lemons, and limes. ? Peppers. ? Spicy food. ? Onions and garlic. ? Vinegar. Lifestyle  Maintain a healthy weight. Ask your doctor what weight is healthy for you. If you need to lose weight, work with your doctor to do so safely.  Exercise for at least 30 minutes for 5 or more days each week, or as told by your doctor.  Wear loose-fitting clothes.  Do not smoke. If you need help quitting, ask your doctor.  Sleep with the head of your bed higher than your feet. Use a wedge under the mattress or blocks under the bed frame to raise the head of the bed. Summary  When you have gastroesophageal reflux disease (GERD), food and lifestyle choices are very important in easing your symptoms.  Eat small meals often instead of 3 large meals a day. Eat your meals slowly, and in a place where you are relaxed.  Limit high-fat foods such as fatty meat or fried foods.  Avoid bending over or lying down until 2-3 hours after eating.  Avoid peppermint and spearmint, caffeine, alcohol, and chocolate. This information is not intended to replace advice given to you by your health care provider. Make sure you discuss any questions  you have with your health care provider. Document Revised: 11/22/2018 Document Reviewed: 09/06/2016 Elsevier Patient Education  2020 Elsevier Inc[employer]    Preventive Care 2-49 Years Old, Female Preventive care refers to visits with your health care provider and lifestyle choices that can promote health and wellness. This includes:  A yearly physical exam. This may also be called an annual well check.  Regular dental visits and eye  exams.  Immunizations.  Screening for certain conditions.  Healthy lifestyle choices, such as eating a healthy diet, getting regular exercise, not using drugs or products that contain nicotine and tobacco, and limiting alcohol use. What can I expect for my preventive care visit? Physical exam Your health care provider will check your:  Height and weight. This may be used to calculate body mass index (BMI), which tells if you are at a healthy weight.  Heart rate and blood pressure.  Skin for abnormal spots. Counseling Your health care provider may ask you questions about your:  Alcohol, tobacco, and drug use.  Emotional well-being.  Home and relationship well-being.  Sexual activity.  Eating habits.  Work and work Statistician.  Method of birth control.  Menstrual cycle.  Pregnancy history. What immunizations do I need?  Influenza (flu) vaccine  This is recommended every year. Tetanus, diphtheria, and pertussis (Tdap) vaccine  You may need a Td booster every 10 years. Varicella (chickenpox) vaccine  You may need this if you have not been vaccinated. Zoster (shingles) vaccine  You may need this after age 35. Measles, mumps, and rubella (MMR) vaccine  You may need at least one dose of MMR if you were born in 1957 or later. You may also need a second dose. Pneumococcal conjugate (PCV13) vaccine  You may need this if you have certain conditions and were not previously vaccinated. Pneumococcal polysaccharide (PPSV23) vaccine  You may need one or two doses if you smoke cigarettes or if you have certain conditions. Meningococcal conjugate (MenACWY) vaccine  You may need this if you have certain conditions. Hepatitis A vaccine  You may need this if you have certain conditions or if you travel or work in places where you may be exposed to hepatitis A. Hepatitis B vaccine  You may need this if you have certain conditions or if you travel or work in places where  you may be exposed to hepatitis B. Haemophilus influenzae type b (Hib) vaccine  You may need this if you have certain conditions. Human papillomavirus (HPV) vaccine  If recommended by your health care provider, you may need three doses over 6 months. You may receive vaccines as individual doses or as more than one vaccine together in one shot (combination vaccines). Talk with your health care provider about the risks and benefits of combination vaccines. What tests do I need? Blood tests  Lipid and cholesterol levels. These may be checked every 5 years, or more frequently if you are over 69 years old.  Hepatitis C test.  Hepatitis B test. Screening  Lung cancer screening. You may have this screening every year starting at age 71 if you have a 30-pack-year history of smoking and currently smoke or have quit within the past 15 years.  Colorectal cancer screening. All adults should have this screening starting at age 39 and continuing until age 50. Your health care provider may recommend screening at age 4 if you are at increased risk. You will have tests every 1-10 years, depending on your results and the type of screening test.  Diabetes screening. This is done by checking your blood sugar (glucose) after you have not eaten for a while (fasting). You may have this done every 1-3 years.  Mammogram. This may be done every 1-2 years. Talk with your health care provider about when you should start having regular mammograms. This may depend on whether you have a family history of breast cancer.  BRCA-related cancer screening. This may be done if you have a family history of breast, ovarian, tubal, or peritoneal cancers.  Pelvic exam and Pap test. This may be done every 3 years starting at age 38. Starting at age 43, this may be done every 5 years if you have a Pap test in combination with an HPV test. Other tests  Sexually transmitted disease (STD) testing.  Bone density scan. This is  done to screen for osteoporosis. You may have this scan if you are at high risk for osteoporosis. Follow these instructions at home: Eating and drinking  Eat a diet that includes fresh fruits and vegetables, whole grains, lean protein, and low-fat dairy.  Take vitamin and mineral supplements as recommended by your health care provider.  Do not drink alcohol if: ? Your health care provider tells you not to drink. ? You are pregnant, may be pregnant, or are planning to become pregnant.  If you drink alcohol: ? Limit how much you have to 0-1 drink a day. ? Be aware of how much alcohol is in your drink. In the [place] one drink equals one 12 oz bottle of beer (355 mL), one 5 oz glass of wine (148 mL), or one 1 oz glass of hard liquor (44 mL). Lifestyle  Take daily care of your teeth and gums.  Stay active. Exercise for at least 30 minutes on 5 or more days each week.  Do not use any products that contain nicotine or tobacco, such as cigarettes, e-cigarettes, and chewing tobacco. If you need help quitting, ask your health care provider.  If you are sexually active, practice safe sex. Use a condom or other form of birth control (contraception) in order to prevent pregnancy and STIs (sexually transmitted infections).  If told by your health care provider, take low-dose aspirin daily starting at age 77. What's next?  Visit your health care provider once a year for a well check visit.  Ask your health care provider how often you should have your eyes and teeth checked.  Stay up to date on all vaccines. This information is not intended to replace advice given to you by your health care provider. Make sure you discuss any questions you have with your health care provider. Document Revised: 04/12/2018 Document Reviewed: 04/12/2018 Elsevier Patient Education  2020 [employer].

## 2020-07-22 NOTE — Assessment & Plan Note (Signed)
Tetanus is up-to-date per patient. Pap smear up-to-date and completed in 2020, interestingly she had a partial hysterectomy.  She does not recall if her cervix remains Mammogram due, ordered  Strongly encouraged weight loss through healthy diet and regular exercise.  Exam today as noted Labs pending.

## 2020-07-22 NOTE — Assessment & Plan Note (Signed)
Repeat A1c pending.  Discussed the importance of a healthy diet and regular exercise in order for weight loss, and to reduce the risk of any potential medical problems.

## 2020-07-22 NOTE — Assessment & Plan Note (Signed)
Chronic for years, continued. Suspect uncontrolled hypertension and obesity are contributing.  We will be working to treat blood pressure.  Discussed low-salt diet, weight loss.  Labs pending.

## 2020-07-22 NOTE — Assessment & Plan Note (Addendum)
Symptoms highly suspicious for GERD. We will trial famotidine 20 mg at bedtime, discussed that she can increase to twice daily or 40 mg at bedtime.  We will see her back in 2 weeks for follow-up. Handout provided for GERD triggers.

## 2020-07-22 NOTE — Progress Notes (Signed)
Subjective:    Patient ID: [name], female    DOB: May 04, 1976, 44 y.o.   MRN: [id number]  HPI  This visit occurred during the SARS-CoV-2 public health emergency.  Safety protocols were in place, including screening questions prior to the visit, additional usage of staff PPE, and extensive cleaning of exam room while observing appropriate contact time as indicated for disinfecting solutions.   [name] is a 44 year old female who presents today for complete physical and follow up of chronic conditions. She would also like to discuss nausea.   1) Essential Hypertension: Numerous levated readings documented over the years. She is compliant to HCTZ 25 mg daily over the last one month, had not been taking consistently over the year. She does not check her blood pressure at home. She has noticed visual changes, frontal and occipital headaches, ankle edema, pedal edema. She's noticed the swelling for over two years. Her occupation has her up and down, she is not sedentary.   BP Readings from Last 3 Encounters:  07/22/20 (!) 140/96  06/17/20 (!) 187/123  10/11/19 (!) 146/97     2) Nausea: Chronic and occurring every other morning when waking, gradual resolves by late morning. She also reports daily esophageal burning, belching that burns, throat fullness. She mixes baking soda and water together and drinks one dose of this daily with resolve in symptoms until the next day. She has not tried any other treatment for GERD.    Immunizations: -Tetanus: Unsure, believes it's been within 10 years.  -Influenza: Completed -Covid-19: Completed   Diet: She endorses a healthy diet, has cut back on beef, pork. She has eliminated sodas. She main drinks water, juice, coffee. Eats mostly home cooked meals.  Exercise: She is not exercising.   Eye exam: Completed in 2021 Dental exam: Completes semi-annually   Pap Smear: Completed in 2020 Mammogram: Completed in 2020  BP Readings from Last 3  Encounters:  07/22/20 (!) 140/96  06/17/20 (!) 187/123  10/11/19 (!) 146/97   Wt Readings from Last 3 Encounters:  07/22/20 284 lb (128.8 kg)  10/11/19 278 lb 6.4 oz (126.3 kg)  09/21/18 273 lb 8 oz (124.1 kg)     Review of Systems  Constitutional: Negative for unexpected weight change.  HENT: Negative for rhinorrhea.   Eyes: Positive for visual disturbance.  Respiratory: Negative for cough and shortness of breath.   Cardiovascular: Negative for chest pain.  Gastrointestinal: Negative for constipation and diarrhea.       GERD  Genitourinary: Negative for difficulty urinating.  Musculoskeletal: Negative for arthralgias and myalgias.  Skin: Negative for rash.  Allergic/Immunologic: Negative for environmental allergies.  Neurological: Positive for headaches. Negative for dizziness and numbness.  Hematological: Negative for adenopathy.  Psychiatric/Behavioral: The patient is not nervous/anxious.        Past Medical History:  Diagnosis Date  . Diverticulitis    November 2018  . Hypertension      Social History   Socioeconomic History  . Marital status: Single    Spouse name: Not on file  . Number of children: Not on file  . Years of education: Not on file  . Highest education level: Not on file  Occupational History  . Not on file  Tobacco Use  . Smoking status: Never Smoker  . Smokeless tobacco: Never Used  Substance and Sexual Activity  . Alcohol use: No  . Drug use: No  . Sexual activity: Not Currently    Birth control/protection: Surgical  Other Topics Concern  . Not on file  Social History Narrative   Single but soon to be engaged.   Twin girls.   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 Difficulty of Paying Living Expenses: Not on file  Food Insecurity:   . Worried About Programme researcher, broadcasting/film/video in the Last Year: Not on file  . Ran Out of Food in the Last Year: Not  on file  Transportation Needs:   . Lack of Transportation (Medical): Not on file  . Lack of Transportation (Non-Medical): Not on file  Physical Activity:   . Days of Exercise per Week: Not on file  . Minutes of Exercise per Session: Not on file  Stress:   . Feeling of Stress : Not on file  Social Connections:   . Frequency of Communication with Friends and Family: Not on file  . Frequency of Social Gatherings with Friends and Family: Not on file  . Attends Religious Services: Not on file  . Active Member of Clubs or Organizations: Not on file  . Attends Banker Meetings: Not on file  . Marital Status: Not on file  Intimate Partner Violence:   . Fear of Current or Ex-Partner: Not on file  . Emotionally Abused: Not on file  . Physically Abused: Not on file  . Sexually Abused: Not on file    Past Surgical History:  Procedure Laterality Date  . ABDOMINAL HYSTERECTOMY     Partial   . APPENDECTOMY    . CHOLECYSTECTOMY    . SMALL INTESTINE SURGERY     November 2018    Family History  Problem Relation Age of Onset  . Diverticulitis Mother   . Hypertension Mother   . Diabetes Father   . CAD Father   . Hypertension Father   . Eczema Father   . Anxiety disorder Father   . Diverticulitis Sister   . Hypertension Sister   . Breast cancer Maternal Aunt     No Known Allergies  Current Outpatient Medications on File Prior to Visit  Medication Sig Dispense Refill  . aspirin-acetaminophen-caffeine (EXCEDRIN MIGRAINE) 250-250-65 MG tablet Take 2 tablets every 6 (six) hours as needed by mouth for headache or migraine.    . hydrochlorothiazide (HYDRODIURIL) 25 MG tablet TAKE 1 TABLET BY MOUTH EVERY DAY 90 tablet 1  . ibuprofen (ADVIL) 200 MG tablet Take 200 mg by mouth every 6 (six) hours as needed.    . NON FORMULARY antihistamine    . UNABLE TO FIND Black Seed Oil (black cumin seed): Take 2 capsules (1,000 mg total) 3-4 times weekly    . [DISCONTINUED] promethazine  (PHENERGAN) 12.5 MG tablet Take 1 tablet (12.5 mg total) by mouth every 8 (eight) hours as needed for nausea or vomiting. 20 tablet 0   No current facility-administered medications on file prior to visit.    BP (!) 140/96   Pulse 88   Temp 97.6 F (36.4 C) (Temporal)   Ht 5' 10.5" (1.791 m)   Wt 284 lb (128.8 kg)   BMI 40.17 kg/m    Objective:   Physical Exam HENT:     Right Ear: Tympanic membrane and ear canal normal.     Left Ear: Tympanic membrane and ear canal normal.  Eyes:     Pupils: Pupils are equal, round, and reactive to light.  Cardiovascular:     Rate  and Rhythm: Normal rate and regular rhythm.  Pulmonary:     Effort: Pulmonary effort is normal.     Breath sounds: Normal breath sounds.  Abdominal:     General: Bowel sounds are normal.     Palpations: Abdomen is soft.     Tenderness: There is no abdominal tenderness.  Musculoskeletal:        General: Normal range of motion.     Cervical back: Neck supple.  Skin:    General: Skin is warm and dry.     Comments: 0.25 cm, rounded, superficial, soft, mobile, non tender mass to right second digit between MCP and PIP joint.  Neurological:     Mental Status: She is alert and oriented to person, place, and time.     Cranial Nerves: No cranial nerve deficit.     Deep Tendon Reflexes:     Reflex Scores:      Patellar reflexes are 2+ on the right side and 2+ on the left side. Psychiatric:        Mood and Affect: Mood normal.            Assessment & Plan:

## 2020-07-22 NOTE — Assessment & Plan Note (Signed)
Inconsistently taking hydrochlorothiazide 25 mg, has been taking daily for the last month.  From chart review it does appear that the 25 mg of hydrochlorothiazide has helped reduce blood pressure, but she remains above goal.  Discussed the importance of weight loss through diet and exercise, lower salt diet to improve blood pressure.  She is symptomatic with headaches and visual changes so we need to treat further with additional medication.  Prescription for olmesartan 20 mg sent to pharmacy.  We will plan to see her back for follow-up in 2 to 3 weeks for blood pressure check and BMP.

## 2020-07-22 NOTE — Assessment & Plan Note (Signed)
More frequent recently, suspect due to uncontrolled hypertension.  We will work to lower blood pressure, monitor for headaches

## 2020-07-23 LAB — LIPID PANEL
Cholesterol: 172 mg/dL (ref 0–200)
HDL: 35.7 mg/dL — ABNORMAL LOW (ref >39.00)
LDL Cholesterol: 100 mg/dL — ABNORMAL HIGH (ref 0–99)
NonHDL: 136.76
Total CHOL/HDL Ratio: 5
Triglycerides: 184 mg/dL — ABNORMAL HIGH (ref 0.0–149.0)
VLDL: 36.8 mg/dL (ref 0.0–40.0)

## 2020-07-23 LAB — COMPREHENSIVE METABOLIC PANEL
ALT: 18 U/L (ref 0–35)
AST: 26 U/L (ref 0–37)
Albumin: 3.9 g/dL (ref 3.5–5.2)
Alkaline Phosphatase: 25 U/L — ABNORMAL LOW (ref 39–117)
BUN: 11 mg/dL (ref 6–23)
CO2: 32 mEq/L (ref 19–32)
Calcium: 9 mg/dL (ref 8.4–10.5)
Chloride: 99 mEq/L (ref 96–112)
Creatinine, Ser: 0.97 mg/dL (ref 0.40–1.20)
GFR: 72.01 mL/min (ref >60.00)
Glucose, Bld: 82 mg/dL (ref 70–99)
Potassium: 3.5 mEq/L (ref 3.5–5.1)
Sodium: 137 mEq/L (ref 135–145)
Total Bilirubin: 0.3 mg/dL (ref 0.2–1.2)
Total Protein: 9 g/dL — ABNORMAL HIGH (ref 6.0–8.3)

## 2020-07-23 LAB — CBC
HCT: 36 % (ref 36.0–46.0)
Hemoglobin: 12.4 g/dL (ref 12.0–15.0)
MCHC: 34.5 g/dL (ref 30.0–36.0)
MCV: 93.8 fl (ref 78.0–100.0)
Platelets: 260 10*3/uL (ref 150.0–400.0)
RBC: 3.84 Mil/uL — ABNORMAL LOW (ref 3.87–5.11)
RDW: 13.2 % (ref 11.5–15.5)
WBC: 4.7 10*3/uL (ref 4.0–10.5)

## 2020-07-23 LAB — TSH: TSH: 1.6 u[IU]/mL (ref 0.35–4.50)

## 2020-07-23 LAB — HEMOGLOBIN A1C: Hgb A1c MFr Bld: 6.1 % (ref 4.6–6.5)

## 2020-08-05 ENCOUNTER — Ambulatory Visit: Payer: BC Managed Care – PPO | Admitting: Primary Care

## 2020-08-05 DIAGNOSIS — Z0289 Encounter for other administrative examinations: Secondary | ICD-10-CM

## 2020-08-13 ENCOUNTER — Other Ambulatory Visit: Payer: Self-pay | Admitting: Primary Care

## 2020-08-13 DIAGNOSIS — I10 Essential (primary) hypertension: Secondary | ICD-10-CM

## 2020-08-13 DIAGNOSIS — K219 Gastro-esophageal reflux disease without esophagitis: Secondary | ICD-10-CM

## 2020-08-13 NOTE — Telephone Encounter (Signed)
Pharmacy requests refill on: Olmesartan 20 mg & Famotidine 20 mg   LAST REFILL: 07/22/2020 LAST OV: 07/22/2020 NEXT OV: 08/21/2020 PHARMACY: [id number] [place], [place]

## 2020-08-21 ENCOUNTER — Ambulatory Visit: Payer: BC Managed Care – PPO | Admitting: Primary Care

## 2020-08-21 DIAGNOSIS — Z0289 Encounter for other administrative examinations: Secondary | ICD-10-CM

## 2020-08-23 DIAGNOSIS — Z1152 Encounter for screening for COVID-19: Secondary | ICD-10-CM | POA: Diagnosis not present

## 2020-09-08 ENCOUNTER — Other Ambulatory Visit: Payer: Self-pay | Admitting: Primary Care

## 2020-09-08 DIAGNOSIS — I10 Essential (primary) hypertension: Secondary | ICD-10-CM

## 2020-09-08 DIAGNOSIS — K219 Gastro-esophageal reflux disease without esophagitis: Secondary | ICD-10-CM

## 2020-09-09 NOTE — Telephone Encounter (Signed)
Pharmacy requests refill on: Olmesartan 20 mg   LAST REFILL: 08/13/2020 (Q-30, R-0) LAST OV: 07/22/2020 NEXT OV: Not Scheduled  PHARMACY: [id number] [place], [place]

## 2020-09-09 NOTE — Telephone Encounter (Signed)
Pharmacy requests refill on: Famotidine 20 mg   LAST REFILL: 08/13/2020 (Q-60, R-0) LAST OV: 07/22/2020 NEXT OV: Not Scheduled  PHARMACY: [id number] [place], [place]

## 2020-09-18 ENCOUNTER — Other Ambulatory Visit: Payer: Self-pay

## 2020-09-18 ENCOUNTER — Encounter: Payer: Self-pay | Admitting: Primary Care

## 2020-09-18 ENCOUNTER — Telehealth (INDEPENDENT_AMBULATORY_CARE_PROVIDER_SITE_OTHER): Payer: BC Managed Care – PPO | Admitting: Primary Care

## 2020-09-18 DIAGNOSIS — R059 Cough, unspecified: Secondary | ICD-10-CM | POA: Insufficient documentation

## 2020-09-18 MED ORDER — HYDROCOD POLST-CPM POLST ER 10-8 MG/5ML PO SUER: 5.0000 mL | Freq: Two times a day (BID) | ORAL | 0 refills | Status: DC | PRN

## 2020-09-18 MED ORDER — BENZONATATE 200 MG PO CAPS: 200.0000 mg | ORAL_CAPSULE | Freq: Three times a day (TID) | ORAL | 0 refills | Status: DC | PRN

## 2020-09-18 NOTE — Progress Notes (Signed)
Subjective:    Patient ID: [name], female    DOB: 1975/10/01, 45 y.o.   MRN: [id number]  HPI  Virtual Visit via Video Note  I connected with [name] on 09/18/20 at  9:40 AM EST by a video enabled telemedicine application and verified that I am speaking with the correct person using two identifiers.  Location: Patient: Work Provider: Office Participants: Patient and myself   I discussed the limitations of evaluation and management by telemedicine and the availability of in person appointments. The patient expressed understanding and agreed to proceed.  History of Present Illness:  [name] is a 45 year old female with a history of hypertension, GERD, prediabetes, migraines who presents today with a chief complaint of cough.  She also reports post nasal drip, scratchy throat, watery eyes, fatigue. Her symptoms began about one week ago. She denies fevers.   She's taken Nyquil, hot tea with honey, Aleve without much relief. Today she feels about the same as she did two days ago. She's not sleeping well at night given her cough.  She's had all three Covid-19 vaccines. No recent testing for Covid-19, plans on repeating testing today after work.    Observations/Objective:  Alert and oriented. Appears tired. Dry cough noted several times during visit. No distress. Speaking in complete sentences.   Assessment and Plan:  Acute viral with allergy symptoms x 7 days, no change.  She is at work and doesn't appear sickly, but she does appear tired. She will go for Covid-19 testing today. In the mean time we will provide her with Tessalon Perles and Tussionex cough syrup to take for relief and sleep. She agrees.  I also recommended she start Zyrtec HS for PND.  She will call Monday next week if no improvement in symptoms.  Follow Up Instructions:  You may take Benzonatate capsules for cough during the day. Take 1 capsule by mouth three times daily as needed for  cough.  You may take the cough suppressant every 12 hours as needed for cough and rest. Caution this medication contains codeine which may cause drowsiness.   Start taking cetirizine (Zyrtec) 10 mg at bedtime for drainage.   Please update me Monday next week if no better. Please let me know the result of your Covid-19 test.  It was a pleasure to see you today! Mayra Reel, NP-C    I discussed the assessment and treatment plan with the patient. The patient was provided an opportunity to ask questions and all were answered. The patient agreed with the plan and demonstrated an understanding of the instructions.   The patient was advised to call back or seek an in-person evaluation if the symptoms worsen or if the condition fails to improve as anticipated.    [name], NP    Review of Systems  Constitutional: Positive for fatigue. Negative for chills and fever.  HENT: Positive for postnasal drip. Negative for congestion.   Eyes:       Watery eyes  Respiratory: Positive for cough. Negative for shortness of breath.   Cardiovascular: Negative for chest pain.  Neurological: Negative for headaches.       Past Medical History:  Diagnosis Date  . Diverticulitis    November 2018  . Hypertension      Social History   Socioeconomic History  . Marital status: Single    Spouse name: Not on file  . Number of children: Not on file  . Years of education:  Not on file  . Highest education level: Not on file  Occupational History  . Not on file  Tobacco Use  . Smoking status: Never Smoker  . Smokeless tobacco: Never Used  Substance and Sexual Activity  . Alcohol use: No  . Drug use: No  . Sexual activity: Not Currently    Birth control/protection: Surgical  Other Topics Concern  . Not on file  Social History Narrative   Single but soon to be engaged.   Twin girls.   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   Enjoys relaxing and Tree surgeon   Social Determinants of Manufacturing engineer Strain: Not on file  Food Insecurity: Not on file  Transportation Needs: Not on file  Physical Activity: Not on file  Stress: Not on file  Social Connections: Not on file  Intimate Partner Violence: Not on file    Past Surgical History:  Procedure Laterality Date  . ABDOMINAL HYSTERECTOMY     Partial   . APPENDECTOMY    . CHOLECYSTECTOMY    . SMALL INTESTINE SURGERY     November 2018    Family History  Problem Relation Age of Onset  . Diverticulitis Mother   . Hypertension Mother   . Diabetes Father   . CAD Father   . Hypertension Father   . Eczema Father   . Anxiety disorder Father   . Diverticulitis Sister   . Hypertension Sister   . Breast cancer Maternal Aunt     No Known Allergies  Current Outpatient Medications on File Prior to Visit  Medication Sig Dispense Refill  . aspirin-acetaminophen-caffeine (EXCEDRIN MIGRAINE) 250-250-65 MG tablet Take 2 tablets every 6 (six) hours as needed by mouth for headache or migraine.    . famotidine (PEPCID) 20 MG tablet TAKE 1 TABLET (20 MG TOTAL) BY MOUTH 2 (TWO) TIMES DAILY. FOR HEARTBURN. 60 tablet 5  . hydrochlorothiazide (HYDRODIURIL) 25 MG tablet Take 1 tablet (25 mg total) by mouth daily. For blood pressure. 90 tablet 3  . ibuprofen (ADVIL) 200 MG tablet Take 200 mg by mouth every 6 (six) hours as needed.    . NON FORMULARY antihistamine    . olmesartan (BENICAR) 20 MG tablet TAKE 1 TABLET (20 MG TOTAL) BY MOUTH DAILY. FOR BLOOD PRESSURE. 30 tablet 5  . UNABLE TO FIND Black Seed Oil (black cumin seed): Take 2 capsules (1,000 mg total) 3-4 times weekly    . [DISCONTINUED] promethazine (PHENERGAN) 12.5 MG tablet Take 1 tablet (12.5 mg total) by mouth every 8 (eight) hours as needed for nausea or vomiting. 20 tablet 0   No current facility-administered medications on file prior to visit.    Ht 5\' 11"  (1.803 m)   Wt 215 lb (97.5 kg)   BMI 29.99 kg/m    Objective:   Physical Exam Constitutional:       Appearance: She is well-nourished.     Comments: Appears tired  Pulmonary:     Effort: Pulmonary effort is normal.     Comments: Dry cough noted several times during visit Neurological:     Mental Status: She is alert and oriented to person, place, and time.  Psychiatric:        Mood and Affect: Mood and affect and mood normal.            Assessment & Plan:

## 2020-09-18 NOTE — Patient Instructions (Signed)
You may take Benzonatate capsules for cough during the day. Take 1 capsule by mouth three times daily as needed for cough.  You may take the cough suppressant every 12 hours as needed for cough and rest. Caution this medication contains codeine which may cause drowsiness.   Start taking cetirizine (Zyrtec) 10 mg at bedtime for drainage.   Please update me Monday next week if no better. Please let me know the result of your Covid-19 test.  It was a pleasure to see you today! Mayra Reel, NP-C

## 2020-09-18 NOTE — Assessment & Plan Note (Signed)
  Acute viral with allergy symptoms x 7 days, no change.  She is at work and doesn't appear sickly, but she does appear tired. She will go for Covid-19 testing today. In the mean time we will provide her with Tessalon Perles and Tussionex cough syrup to take for relief and sleep. She agrees.  I also recommended she start Zyrtec HS for PND.  She will call Monday next week if no improvement in symptoms.

## 2020-10-07 ENCOUNTER — Telehealth: Payer: Self-pay

## 2020-10-07 NOTE — Telephone Encounter (Signed)
Will you call to check on patient? How's she doing? Did she get her HCTZ?

## 2020-10-07 NOTE — Telephone Encounter (Signed)
[facility] Night - Client TELEPHONE ADVICE RECORD AccessNurse Patient Name: [name] Gender: Female DOB: [birth year] Age: 45 Y 10 M Return Phone Number: [phone] (Primary) Address[place] [address] Client [facility] Night - Client Client Site [facility] - Night Physician Vernona Rieger - NP Contact Type Call Who Is Calling Patient / Member / Family / Caregiver Call Type Triage / Clinical Relationship To Patient Self Return Phone Number [phone] (Primary) Chief Complaint Heart palpitations or irregular heartbeat Reason for Call Medication Question / Request Initial Comment Caller says that she had a text message reminder that her script for her BP med is ready to be picked up, but she does not have refills, and she is totally out. She needs for the doctor to call it in. Her heart rate is a bit fast; she can hear it in her ears. Translation No Nurse Assessment Nurse: Reinaldo Berber, RN, Adriana Date/Time (Eastern Time): 10/06/2020 6:05:43 PM Confirm and document reason for call. If symptomatic, describe symptoms. ---Caller reports her heart rate is up, she can hear her heart rate in her ear. BP medication HCTZ 25MG ? Last dose taken a week ago. C/o migraines Does the patient have any new or worsening symptoms? ---Yes Will a triage be completed? ---Yes Related visit to physician within the last 2 weeks? ---No Does the PT have any chronic conditions? (i.e. diabetes, asthma, this includes High risk factors for pregnancy, etc.) ---Yes List chronic conditions. ---htn, migraines Is the patient pregnant or possibly pregnant? (Ask all females between the ages of 48-55) ---No Is this a behavioral health or substance abuse call? ---No Guidelines Guideline Title Affirmed Question Affirmed Notes Nurse Date/Time (Eastern Time) Heart Rate and Heartbeat Questions New or worsened  ankle swelling 14-58, RN, Reinaldo Berber 10/06/2020 6:09:17 PM Disp. Time 10/08/2020 Time) Disposition Final User 10/06/2020 6:12:40 PM See HCP within 4 Hours (or PCP triage) Yes Cisneros, RN, Adriana PLEASE NOTE: All timestamps contained within this report are represented as 10/08/2020 Standard Time. CONFIDENTIALTY NOTICE: This fax transmission is intended only for the addressee. It contains information that is legally privileged, confidential or otherwise protected from use or disclosure. If you are not the intended recipient, you are strictly prohibited from reviewing, disclosing, copying using or disseminating any of this information or taking any action in reliance on or regarding this information. If you have received this fax in error, please notify [place] immediately by telephone so that we can arrange for its return to [place]. Phone: [phone], Toll-Free: [phone], Fax: [phone] Page: 2 of 2 Call Id: [phone] Caller Disagree/Comply Comply Caller Understands Yes PreDisposition Call Doctor Care Advice Given Per Guideline SEE HCP (OR PCP TRIAGE) WITHIN 4 HOURS: CARE ADVICE given per Heart Rate and Heartbeat Questions (Adult) guideline. CALL BACK IF: * You become worse Comments User: [id number], RN Date/Time Bryson Corona Time): 10/06/2020 6:09:15 PM rx ready for pickup per pharmacy, Referrals [facility] at [facility] - UC

## 2020-10-07 NOTE — Telephone Encounter (Signed)
Pt called back; I spoke with pt; pt said [facility] advised her she did not have available refills on HCTZ 25 mg. I spoke with Clifton Custard at [facility] and he said pt requested refill on hydrocodone chlorpheniramine (Tussionex) and was advised she did not have refills on that med and to contact [facility]. Pt said she did not know why Clifton Custard said that because she requested HCTZ 25 mg; Clifton Custard said that the HCTZ was on file and on auto refill. I advised pt to contact [facility] about picking up the HCTZ 25 mg. Pt has also felt fast heart beat and pt went to her moms and she could not remember what her BP was she just remembered it was elevated each time she took it and the heart rate was 100 - 101. Pt denied, CP,SOB.,H/A or dizziness but was concerned about BP and heart rate being elevated. Pt said she would contact [facility] about refill of HCTZ because she has been out of med for about 1 wk. Pt also will go to [facility] in [place] to get BP and heart rate checked out. Sending note to Allayne Gitelman NP.

## 2020-10-07 NOTE — Telephone Encounter (Signed)
Unable to reach pt by phone to verify pt picked up her med and to see if pt went to [facility] as access nurse instructed. Sending note to  Allayne Gitelman NP & Park Meo CMA.

## 2020-10-09 NOTE — Telephone Encounter (Signed)
Noted and glad to hear! 

## 2020-10-09 NOTE — Telephone Encounter (Signed)
Called and spoke to patient. She was informed that med's are ready she will get picked up today. She will call and let us know if any issues. Doing ok no symptoms at this time.

## 2020-10-11 ENCOUNTER — Other Ambulatory Visit: Payer: Self-pay

## 2020-10-11 ENCOUNTER — Ambulatory Visit (HOSPITAL_COMMUNITY)
Admission: EM | Admit: 2020-10-11 | Discharge: 2020-10-11 | Disposition: A | Discharge status: Home / Self Care | Payer: BC Managed Care – PPO | Attending: Student | Admitting: Student

## 2020-10-11 ENCOUNTER — Encounter (HOSPITAL_COMMUNITY): Payer: Self-pay

## 2020-10-11 DIAGNOSIS — I10 Essential (primary) hypertension: Secondary | ICD-10-CM

## 2020-10-11 DIAGNOSIS — H66001 Acute suppurative otitis media without spontaneous rupture of ear drum, right ear: Secondary | ICD-10-CM

## 2020-10-11 MED ORDER — AMOXICILLIN-POT CLAVULANATE 875-125 MG PO TABS: 1.0000 | ORAL_TABLET | Freq: Two times a day (BID) | ORAL | 0 refills | Status: DC

## 2020-10-11 NOTE — ED Provider Notes (Signed)
[facility]    CSN: [id number] Arrival date & time: 10/11/20  1340      History   Chief Complaint Chief Complaint  Patient presents with  . Otalgia    HPI [name] is a 45 y.o. female presenting with right ear pain for 12 hours.  History of diverticulitis, hypertension, cough, GERD, prediabetes, palpitations, migraines.  Endorses right-sided ear pain and preauricular lymphadenopathy for 12 hours. Denies hearing changes, dizziness. Denies fevers/chills, n/v/d, shortness of breath, chest pain, cough, congestion, facial pain, teeth pain, headaches, sore throat, loss of taste/smell, swollen lymph nodes.   HPI  Past Medical History:  Diagnosis Date  . Diverticulitis    November 2018  . Hypertension     Patient Active Problem List   Diagnosis Date Noted  . Cough 09/18/2020  . Gastroesophageal reflux disease 07/22/2020  . Prediabetes 09/21/2018  . Palpitations 09/21/2018  . Encounter for annual general medical examination with abnormal findings in adult 09/21/2018  . Pedal edema 04/04/2018  . Migraines 09/07/2017  . Essential hypertension 09/07/2017    Past Surgical History:  Procedure Laterality Date  . ABDOMINAL HYSTERECTOMY     Partial   . APPENDECTOMY    . CHOLECYSTECTOMY    . SMALL INTESTINE SURGERY     November 2018    OB History   No obstetric history on file.      Home Medications    Prior to Admission medications   Medication Sig Start Date End Date Taking? Authorizing Provider  amoxicillin-clavulanate (AUGMENTIN) 875-125 MG tablet Take 1 tablet by mouth every 12 (twelve) hours. 10/11/20  Yes Rhys Martini, PA-C  aspirin-acetaminophen-caffeine (EXCEDRIN MIGRAINE) 323 [id number] 8000 MG tablet Take 2 tablets every 6 (six) hours as needed by mouth for headache or migraine.    [provider]  benzonatate (TESSALON) 200 MG capsule Take 1 capsule (200 mg total) by mouth 3 (three) times daily as needed for cough. 09/18/20   [name],  NP  chlorpheniramine-HYDROcodone [facility] PENNKINETIC ER) 10-8 MG/5ML SUER Take 5 mLs by mouth every 12 (twelve) hours as needed for cough. 09/18/20   [name], NP  famotidine (PEPCID) 20 MG tablet TAKE 1 TABLET (20 MG TOTAL) BY MOUTH 2 (TWO) TIMES DAILY. FOR HEARTBURN. 09/09/20   [name], NP  hydrochlorothiazide (HYDRODIURIL) 25 MG tablet Take 1 tablet (25 mg total) by mouth daily. For blood pressure. 07/22/20   [name], NP  ibuprofen (ADVIL) 200 MG tablet Take 200 mg by mouth every 6 (six) hours as needed.    [provider]  NON FORMULARY antihistamine    [provider]  olmesartan (BENICAR) 20 MG tablet TAKE 1 TABLET (20 MG TOTAL) BY MOUTH DAILY. FOR BLOOD PRESSURE. 09/09/20   [name], NP  UNABLE TO FIND Black Seed Oil (black cumin seed): Take 2 capsules (1,000 mg total) 3-4 times weekly    [provider]  promethazine (PHENERGAN) 12.5 MG tablet Take 1 tablet (12.5 mg total) by mouth every 8 (eight) hours as needed for nausea or vomiting. 02/01/19 06/17/20  [name], NP    Family History Family History  Problem Relation Age of Onset  . Diverticulitis Mother   . Hypertension Mother   . Diabetes Father   . CAD Father   . Hypertension Father   . Eczema Father   . Anxiety disorder Father   . Diverticulitis Sister   . Hypertension Sister   . Breast cancer Maternal Aunt  Social History Social History   Tobacco Use  . Smoking status: Never Smoker  . Smokeless tobacco: Never Used  Substance Use Topics  . Alcohol use: No  . Drug use: No     Allergies   Patient has no known allergies.   Review of Systems Review of Systems  Constitutional: Negative for appetite change, chills and fever.  HENT: Positive for ear pain. Negative for congestion, rhinorrhea, sinus pressure, sinus pain and sore throat.   Eyes: Negative for redness and visual disturbance.  Respiratory: Negative for cough, chest tightness,  shortness of breath and wheezing.   Cardiovascular: Negative for chest pain and palpitations.  Gastrointestinal: Negative for abdominal pain, constipation, diarrhea, nausea and vomiting.  Genitourinary: Negative for dysuria, frequency and urgency.  Musculoskeletal: Negative for myalgias.  Neurological: Negative for dizziness, weakness and headaches.  Psychiatric/Behavioral: Negative for confusion.  All other systems reviewed and are negative.    Physical Exam Triage Vital Signs ED Triage Vitals  Enc Vitals Group     BP 10/11/20 1351 (!) 145/100     Pulse Rate 10/11/20 1351 95     Resp 10/11/20 1351 18     Temp 10/11/20 1351 98.2 F (36.8 C)     Temp Source 10/11/20 1351 Oral     SpO2 10/11/20 1351 100 %     Weight --      Height --      Head Circumference --      Peak Flow --      Pain Score 10/11/20 1350 4     Pain Loc --      Pain Edu? --      Excl. in GC? --    No data found.  Updated Vital Signs BP (!) 145/100 (BP Location: Right Arm)   Pulse 95   Temp 98.2 F (36.8 C) (Oral)   Resp 18   SpO2 100%   Visual Acuity Right Eye Distance:   Left Eye Distance:   Bilateral Distance:    Right Eye Near:   Left Eye Near:    Bilateral Near:     Physical Exam Vitals reviewed.  Constitutional:      General: She is not in acute distress.    Appearance: Normal appearance. She is not ill-appearing.  HENT:     Head: Normocephalic and atraumatic.     Right Ear: Hearing, ear canal and external ear normal. No swelling or tenderness. There is no impacted cerumen. No mastoid tenderness. Tympanic membrane is erythematous. Tympanic membrane is not perforated, retracted or bulging.     Left Ear: Hearing, tympanic membrane, ear canal and external ear normal. No swelling or tenderness. There is no impacted cerumen. No mastoid tenderness. Tympanic membrane is not perforated, erythematous, retracted or bulging.     Ears:     Comments: R preauricular lymphadenopathy    Nose:      Right Sinus: No maxillary sinus tenderness or frontal sinus tenderness.     Left Sinus: No maxillary sinus tenderness or frontal sinus tenderness.     Mouth/Throat:     Mouth: Mucous membranes are moist.     Pharynx: Uvula midline. No oropharyngeal exudate or posterior oropharyngeal erythema.     Tonsils: No tonsillar exudate.  Cardiovascular:     Rate and Rhythm: Normal rate and regular rhythm.     Heart sounds: Normal heart sounds.  Pulmonary:     Breath sounds: Normal breath sounds and air entry. No wheezing, rhonchi or rales.  Chest:  Chest wall: No tenderness.  Abdominal:     General: Abdomen is flat. Bowel sounds are normal.     Tenderness: There is no abdominal tenderness. There is no guarding or rebound.  Lymphadenopathy:     Cervical: No cervical adenopathy.  Neurological:     General: No focal deficit present.     Mental Status: She is alert and oriented to person, place, and time.  Psychiatric:        Attention and Perception: Attention and perception normal.        Mood and Affect: Mood and affect normal.        Behavior: Behavior normal. Behavior is cooperative.        Thought Content: Thought content normal.        Judgment: Judgment normal.      UC Treatments / Results  Labs (all labs ordered are listed, but only abnormal results are displayed) Labs Reviewed - No data to display  EKG   Radiology No results found.  Procedures Procedures (including critical care time)  Medications Ordered in UC Medications - No data to display  Initial Impression / Assessment and Plan / UC Course  I have reviewed the triage vital signs and the nursing notes.  Pertinent labs & imaging results that were available during my care of the patient were reviewed by me and considered in my medical decision making (see chart for details).     This patient is a 45 year old female presenting with right otitis media.  Augmentin sent as below. For hypertension, she has not  taken her antihypertensives yet today.  Denies chest pain, vision changes, headaches, left arm pain, etc.  Continue antihypertensives as directed.  Declines Covid test given lack of URI symptoms.  I am in agreement this plan.  Spent over 40 minutes obtaining H&P, performing physical, discussing  treatment plan and plan for follow-up with patient. Patient agrees with plan.   Final Clinical Impressions(s) / UC Diagnoses   Final diagnoses:  Non-recurrent acute suppurative otitis media of right ear without spontaneous rupture of tympanic membrane  Essential hypertension     Discharge Instructions     -Start the antibiotic-Augmentin, 2 pills daily for 7 days.  You can take this with food, like with breakfast and dinner. -Come back and see [place] if your symptoms get worse instead of better despite treatment; like new fever/chills, worsening of ear pain, dizziness etc.    ED Prescriptions    Medication Sig Dispense Auth. Provider   amoxicillin-clavulanate (AUGMENTIN) 875-125 MG tablet Take 1 tablet by mouth every 12 (twelve) hours. 14 tablet Rhys Martini, PA-C     PDMP not reviewed this encounter.   Rhys Martini, PA-C 10/11/20 1423

## 2020-10-11 NOTE — ED Triage Notes (Signed)
Pt reports right ear pain since this morning. Denies drainage, chills, fever.

## 2020-10-11 NOTE — Discharge Instructions (Signed)
-  Start the antibiotic-Augmentin, 2 pills daily for 7 days.  You can take this with food, like with breakfast and dinner. -Come back and see [place] if your symptoms get worse instead of better despite treatment; like new fever/chills, worsening of ear pain, dizziness etc.

## 2020-10-12 ENCOUNTER — Telehealth: Payer: Self-pay

## 2020-10-12 NOTE — Telephone Encounter (Signed)
[facility] - Client TELEPHONE ADVICE RECORD AccessNurse Patient Name: [name] Gender: Female DOB: 10-Dec-1975 Age: 45 Y 10 M 6 D Return Phone Number: [phone] (Primary) Address: [place] [address] Client [facility] Day - Client Client Site [facility] - Day Physician Vernona Rieger - NP Contact Type Call Who Is Calling Patient / Member / Family / Caregiver Call Type Triage / Clinical Relationship To Patient Self Return Phone Number [phone] (Primary) Chief Complaint Fatigue (>THREE MONTHS) Reason for Call Symptomatic / Request for Health Information Initial Comment Caller states that she went to the urgent care for an ear infection. She states that they prescribed medication for this. She is still feeling fatigue and lethargy. GOTO Facility Not Listed Urgent care undecided. Translation No Nurse Assessment Nurse: Suezanne Jacquet, RN, Riley Lam Date/Time (Eastern Time): 10/12/2020 12:58:50 PM Confirm and document reason for call. If symptomatic, describe symptoms. ---Caller states that she went to the urgent care for an ear infection. She states that they prescribed medication for this. She is still feeling fatigue and lethargy. Currently started on amoxil and had 3 doses of it so far. Does the patient have any new or worsening symptoms? ---Yes Will a triage be completed? ---Yes Related visit to physician within the last 2 weeks? ---Yes Does the PT have any chronic conditions? (i.e. diabetes, asthma, this includes High risk factors for pregnancy, etc.) ---Yes List chronic conditions. ---htn Is the patient pregnant or possibly pregnant? (Ask all females between the ages of 49-55) ---No Is this a behavioral health or substance abuse call? ---No Guidelines Guideline Title Affirmed Question Affirmed Notes Nurse Date/Time (Eastern Time) Ear - Otitis Media Follow-up Call Walking is very  unsteady or feels very dizzy Suezanne Jacquet, RN, Riley Lam 10/12/2020 12:59:58 PM Disp. Time Lamount Cohen Time) Disposition Final User 10/12/2020 1:04:36 PM See HCP within 4 Hours (or PCP triage) Yes Suezanne Jacquet, RN, Riley Lam PLEASE NOTE: All timestamps contained within this report are represented as [place] Standard Time. CONFIDENTIALTY NOTICE: This fax transmission is intended only for the addressee. It contains information that is legally privileged, confidential or otherwise protected from use or disclosure. If you are not the intended recipient, you are strictly prohibited from reviewing, disclosing, copying using or disseminating any of this information or taking any action in reliance on or regarding this information. If you have received this fax in error, please notify us immediately by telephone so that we can arrange for its return to [place]. Phone: [phone], Toll-Free: [phone], Fax: [phone] Page: 2 of 2 Call Id: [id number] Caller Disagree/Comply Comply Caller Understands Yes PreDisposition Did not know what to do Care Advice Given Per Guideline * UCC: Some UCCs can manage patients who are stable and have less serious symptoms (e.g., minor illnesses and injuries). The triager must know the [facility] capabilities before sending a patient there. If unsure, call ahead. ANOTHER ADULT SHOULD DRIVE: CALL BACK IF: * You become worse CARE ADVICE given per Ear - Otitis Media Follow-Up Call (Adult) guideline Comments User: Cameron Proud, RN Date/Time (Eastern Time): 10/12/2020 1:01:14 PM Rates body aches 4/10 on scale. User: Cameron Proud, RN Date/Time Lamount Cohen Time): 10/12/2020 1:05:14 PM No appt available with the back line. Referrals GO TO FACILITY OTHER - SPECIFY

## 2020-10-13 NOTE — Telephone Encounter (Signed)
Have her stop the Augmentin antibiotics. Can we get her in for evaluation?

## 2020-10-13 NOTE — Telephone Encounter (Signed)
Called and LVM for patient to return call. 

## 2020-10-13 NOTE — Telephone Encounter (Signed)
Patient stated that she is doing much better today and doesn't feel that she needs to come in, but she will let us know if this changes.

## 2020-10-13 NOTE — Telephone Encounter (Signed)
Noted and glad to hear! 

## 2020-10-16 ENCOUNTER — Ambulatory Visit: Payer: BC Managed Care – PPO | Admitting: Internal Medicine

## 2020-10-16 DIAGNOSIS — Z0289 Encounter for other administrative examinations: Secondary | ICD-10-CM

## 2020-11-06 ENCOUNTER — Telehealth: Payer: Self-pay

## 2020-11-06 NOTE — Telephone Encounter (Signed)
Please have her set up a visit with me Tuesday next week for BP check.

## 2020-11-06 NOTE — Telephone Encounter (Signed)
[facility] Night - Client TELEPHONE ADVICE RECORD Access[facility] Patient Name: SERRENA LINDERMAN Gender: Female DOB: Dec 24, 1975 Age: 45 Y 57 M 2 D Return Phone Number: [phone] (Primary) Address: [place] [address] Client [facility] Night - Client Client Site [facility] [place] - Night Physician Vernona Rieger - NP Contact Type Call Who Is Calling Patient / Member / Family / Caregiver Call Type Triage / Clinical Relationship To Patient Self Return Phone Number [phone] (Primary) Chief Complaint Blood Pressure High Reason for Call Symptomatic / Request for Health Information Initial Comment Caller states her BP is too high. BP is 180/110. Caller has stopped taking HCTZ. Translation No Nurse Assessment Nurse: Noe Gens, RN, Archie Patten Date/Time (Eastern Time): 11/05/2020 6:52:30 PM Confirm and document reason for call. If symptomatic, describe symptoms. ---Caller states her BP is too high BP is 180/110 about 1 hour ago. Caller has stopped taking HCTZ due to recall. Denies any other issues. Does the patient have any new or worsening symptoms? ---Yes Will a triage be completed? ---Yes Related visit to physician within the last 2 weeks? ---No Does the PT have any chronic conditions? (i.e. diabetes, asthma, this includes High risk factors for pregnancy, etc.) ---Yes List chronic conditions. ---HTN, Diverticulitis Is the patient pregnant or possibly pregnant? (Ask all females between the ages of 57-55) ---No Is this a behavioral health or substance abuse call? ---No Guidelines Guideline Title Affirmed Question Affirmed Notes Nurse Date/Time (Eastern Time) Blood Pressure - High [1] Systolic BP >= 160 OR Diastolic >= 100 AND [2] cardiac or neurologic symptoms (e.g., chest pain, difficulty breathing, unsteady gait, blurred vision) Noe Gens, RN, Tonya 11/05/2020 6:55:21 PM Disp. Time Lamount Cohen Time)  Disposition Final User 11/05/2020 6:57:50 PM Go to ED Now Yes Noe Gens, RN, Archie Patten PLEASE NOTE: All timestamps contained within this report are represented as [place] Standard Time. CONFIDENTIALTY NOTICE: This fax transmission is intended only for the addressee. It contains information that is legally privileged, confidential or otherwise protected from use or disclosure. If you are not the intended recipient, you are strictly prohibited from reviewing, disclosing, copying using or disseminating any of this information or taking any action in reliance on or regarding this information. If you have received this fax in error, please notify us immediately by telephone so that we can arrange for its return to [place]. Phone: [phone], Toll-Free: [phone], Fax: [phone] Page: 2 of 2 Call Id: [id number] Caller Disagree/Comply Comply Caller Understands Yes PreDisposition InappropriateToAsk Care Advice Given Per Guideline GO TO ED NOW: * You need to be seen in the Emergency Department. * Go to the ED at ___________ Hospital. * Leave now. Drive carefully. NOTE TO TRIAGER - DRIVING: * Another adult should drive. * Patient should not delay going to the emergency department. * Passes out or faints * Becomes confused * Becomes too weak to stand * You become worse CALL EMS 911 IF: CARE ADVICE given per High Blood Pressure (Adult) guideline. Referrals [facility] - ED

## 2020-11-06 NOTE — Telephone Encounter (Signed)
I have called pt l/m to call office for app. Can you please call to see if you can get her on as requested by Jae Dire.

## 2020-11-06 NOTE — Telephone Encounter (Signed)
I spoke with pt; pt did not go to UC or ED. Pt did HCTZ 25 mg and olmesartan 20 mg. Pt is presently on her way to work and has not taken BP since last night when was elevated. Pt said when she gets to work she will ck BP. If still elevated she will go to [facility] or ED and otherwise will cb for appt next wk for FU. Now no H/A,dizziness,CP,?SOB,vision changes or weakness in arms or legs. UC & ED precautions given and pt voiced understanding. Sending note to Allayne Gitelman NP and [facility] CMA. Pt will also confirm with pharmacy if her HCTZ is included in recall or not.

## 2020-11-09 NOTE — Telephone Encounter (Signed)
Viewed schedule and patient scheduled for 3/29.

## 2020-11-10 ENCOUNTER — Ambulatory Visit: Payer: BC Managed Care – PPO | Admitting: Primary Care

## 2020-11-10 ENCOUNTER — Encounter: Payer: Self-pay | Admitting: Primary Care

## 2020-11-10 ENCOUNTER — Other Ambulatory Visit: Payer: Self-pay

## 2020-11-10 DIAGNOSIS — I10 Essential (primary) hypertension: Secondary | ICD-10-CM

## 2020-11-10 DIAGNOSIS — Z566 Other physical and mental strain related to work: Secondary | ICD-10-CM | POA: Diagnosis not present

## 2020-11-10 NOTE — Assessment & Plan Note (Signed)
BP today well controlled on regimen of olmesartan 20 mg and HCTZ 25 mg. Continue same.   Agree that stress/anxiety may be contributing to BP fluctuations. Agree to provide letter for her to start working from home on April 4th for 3 months.   She will notify her HR department. Letter provided today.   She will come back in 2 weeks for BP nurse visit check to ensure stability.

## 2020-11-10 NOTE — Assessment & Plan Note (Addendum)
Increased stress over the last several months, affecting BP readings.  Agree that stress/anxiety may be contributing to BP fluctuations. Agree to provide letter for her to start working from home on April 4th for 3 months.   She will notify her HR department. Letter provided today.   She will come back in 2 weeks for BP nurse visit check to ensure stability.

## 2020-11-10 NOTE — Patient Instructions (Signed)
Continue taking olmesartan 20 mg and hydrochlorothiazide 25 mg daily for blood pressure.  Schedule a nurse visit for blood pressure check for two weeks.  It was a pleasure to see you today!

## 2020-11-10 NOTE — Progress Notes (Signed)
Subjective:    Patient ID: [name], female    DOB: [birth year], 45 y.o.   MRN: [id number]  HPI  [name] is a very pleasant 45 y.o. female with a history of hypertension, migraines, GERD, prediabetes who presents today for evaluation of hypertension.  Currently managed on HCTZ 25 mg daily, olmesartan 20 mg daily.    Last week she was trying to give blood at the [employer] and BP was too high at 149/110. A few days later she was at work and had an employee check her BP which was 149/96.   She called last week with reports of these fluctuating BP readings. She does not have a BP cuff at home. She endorses a lot of stress at work which she suspects is largely contributing to her BP fluctuations. She does admit to to missing a few doses of her medication at times, infrequent.   She denies chest pain, dizziness, shortness of breath. She is requesting permission to work from home for stress reduction and BP stabilization. She can complete all of her work from home.   She would like a trial of working from home for 3 months, beginning April 4th, then ease back into her office/work environment. She has yet to notify her HR department.    BP Readings from Last 3 Encounters:  11/10/20 126/62  10/11/20 (!) 145/100  07/22/20 (!) 140/96      Review of Systems  Respiratory: Negative for shortness of breath.   Cardiovascular: Negative for chest pain.  Neurological: Negative for dizziness and headaches.  Psychiatric/Behavioral: The patient is nervous/anxious.          Past Medical History:  Diagnosis Date  . Diverticulitis    November 2018  . Hypertension     Social History   Socioeconomic History  . Marital status: Single    Spouse name: Not on file  . Number of children: Not on file  . Years of education: Not on file  . Highest education level: Not on file  Occupational History  . Not on file  Tobacco Use  . Smoking status: Never Smoker  . Smokeless  tobacco: Never Used  Substance and Sexual Activity  . Alcohol use: No  . Drug use: No  . Sexual activity: Not Currently    Birth control/protection: Surgical  Other Topics Concern  . Not on file  Social History Narrative   Single but soon to be engaged.   Twin girls.   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, environmental   Enjoys relaxing and Tree surgeon   Social Determinants of Corporate investment banker Strain: Not on file  Food Insecurity: Not on file  Transportation Needs: Not on file  Physical Activity: Not on file  Stress: Not on file  Social Connections: Not on file  Intimate Partner Violence: Not on file    Past Surgical History:  Procedure Laterality Date  . ABDOMINAL HYSTERECTOMY     Partial   . APPENDECTOMY    . CHOLECYSTECTOMY    . SMALL INTESTINE SURGERY     November 2018    Family History  Problem Relation Age of Onset  . Diverticulitis Mother   . Hypertension Mother   . Diabetes Father   . CAD Father   . Hypertension Father   . Eczema Father   . Anxiety disorder Father   . Diverticulitis Sister   . Hypertension Sister   . Breast cancer Maternal Aunt     No Known  Allergies  Current Outpatient Medications on File Prior to Visit  Medication Sig Dispense Refill  . amoxicillin-clavulanate (AUGMENTIN) 875-125 MG tablet Take 1 tablet by mouth every 12 (twelve) hours. 14 tablet 0  . aspirin-acetaminophen-caffeine (EXCEDRIN MIGRAINE) 250-250-65 MG tablet Take 2 tablets every 6 (six) hours as needed by mouth for headache or migraine.    . benzonatate (TESSALON) 200 MG capsule Take 1 capsule (200 mg total) by mouth 3 (three) times daily as needed for cough. 15 capsule 0  . chlorpheniramine-HYDROcodone (TUSSIONEX PENNKINETIC ER) 10-8 MG/5ML SUER Take 5 mLs by mouth every 12 (twelve) hours as needed for cough. 50 mL 0  . famotidine (PEPCID) 20 MG tablet TAKE 1 TABLET (20 MG TOTAL) BY MOUTH 2 (TWO) TIMES DAILY. FOR HEARTBURN. 60 tablet 5  . hydrochlorothiazide (HYDRODIURIL) 25 MG  tablet Take 1 tablet (25 mg total) by mouth daily. For blood pressure. 90 tablet 3  . ibuprofen (ADVIL) 200 MG tablet Take 200 mg by mouth every 6 (six) hours as needed.    . NON FORMULARY antihistamine    . olmesartan (BENICAR) 20 MG tablet TAKE 1 TABLET (20 MG TOTAL) BY MOUTH DAILY. FOR BLOOD PRESSURE. 30 tablet 5  . UNABLE TO FIND Black Seed Oil (black cumin seed): Take 2 capsules (1,000 mg total) 3-4 times weekly    . [DISCONTINUED] promethazine (PHENERGAN) 12.5 MG tablet Take 1 tablet (12.5 mg total) by mouth every 8 (eight) hours as needed for nausea or vomiting. 20 tablet 0   No current facility-administered medications on file prior to visit.    BP 126/62   Pulse 97   Temp 97.9 F (36.6 C)   Ht 5\' 11"  (1.803 m)   Wt 286 lb 4 oz (129.8 kg)   SpO2 100%   BMI 39.92 kg/m  Objective:   Physical Exam Cardiovascular:     Rate and Rhythm: Normal rate and regular rhythm.  Pulmonary:     Effort: Pulmonary effort is normal.     Breath sounds: Normal breath sounds.  Musculoskeletal:     Cervical back: Neck supple.  Skin:    General: Skin is warm and dry.  Psychiatric:        Mood and Affect: Mood normal.           Assessment & Plan:      This visit occurred during the SARS-CoV-2 public health emergency.  Safety protocols were in place, including screening questions prior to the visit, additional usage of staff PPE, and extensive cleaning of exam room while observing appropriate contact time as indicated for disinfecting solutions.

## 2020-11-13 ENCOUNTER — Telehealth: Payer: Self-pay

## 2020-11-13 NOTE — Telephone Encounter (Signed)
Pt returned my call.  I gathered information to start processing paperwork on Monday.  I informed the pt of the process and that I would call here when I faxed the finalized paperwork.

## 2020-11-13 NOTE — Telephone Encounter (Signed)
LVM for pt stating that we received her FMLA paperwork and have a few questions to get things started.  Stated I will call her again Monday as we are about to close clinic.

## 2020-11-16 NOTE — Telephone Encounter (Signed)
FMLA paperwork semi filled out  Placed in PCP's inbox for completion, sign and date

## 2020-11-20 NOTE — Telephone Encounter (Signed)
Pt notified FMLA paperwork completed and faxed  Copy for pt up front  Copy for scan   Copy retained by me

## 2020-11-20 NOTE — Telephone Encounter (Signed)
Completed and placed in joellen's inbox °

## 2020-11-23 NOTE — Telephone Encounter (Signed)
Pt called to have verbiage changed on FMLA paperwork.  Page 7 Section 4 amended and refaxed  Copy mailed to pt per request  Copy for scan  Copy retained by me

## 2020-11-25 ENCOUNTER — Ambulatory Visit: Payer: BC Managed Care – PPO | Admitting: Primary Care

## 2020-11-26 NOTE — Telephone Encounter (Signed)
[facility] called questioning addended paperwork, I verified we made the changes

## 2020-12-02 ENCOUNTER — Ambulatory Visit: Payer: BC Managed Care – PPO | Admitting: Primary Care

## 2020-12-11 ENCOUNTER — Ambulatory Visit: Payer: BC Managed Care – PPO | Admitting: Primary Care

## 2021-01-06 ENCOUNTER — Encounter: Payer: Self-pay | Admitting: Primary Care

## 2021-01-06 ENCOUNTER — Other Ambulatory Visit: Payer: Self-pay

## 2021-01-06 ENCOUNTER — Ambulatory Visit: Payer: BC Managed Care – PPO | Admitting: Primary Care

## 2021-01-06 VITALS — BP 128/92 | HR 102 | Temp 97.7°F | Ht 71.0 in | Wt 284.0 lb

## 2021-01-06 DIAGNOSIS — I10 Essential (primary) hypertension: Secondary | ICD-10-CM | POA: Diagnosis not present

## 2021-01-06 DIAGNOSIS — G43709 Chronic migraine without aura, not intractable, without status migrainosus: Secondary | ICD-10-CM | POA: Diagnosis not present

## 2021-01-06 DIAGNOSIS — R7303 Prediabetes: Secondary | ICD-10-CM | POA: Diagnosis not present

## 2021-01-06 LAB — POCT GLYCOSYLATED HEMOGLOBIN (HGB A1C): Hemoglobin A1C: 6.2 % — AB (ref 4.0–5.6)

## 2021-01-06 MED ORDER — AMLODIPINE BESYLATE 5 MG PO TABS: 5.0000 mg | ORAL_TABLET | Freq: Every day | ORAL | 0 refills | Status: DC

## 2021-01-06 NOTE — Assessment & Plan Note (Signed)
Occurring twice monthly on average, resolved with Excedrin Migraine. Continue to monitor.

## 2021-01-06 NOTE — Assessment & Plan Note (Signed)
Repeat A1C pending. 

## 2021-01-06 NOTE — Progress Notes (Signed)
Subjective:    Patient ID: [name], female    DOB: 1976-05-11, 45 y.o.   MRN: [id number]  HPI  [name] is a very pleasant 45 y.o. female with a history of hypertension, migraines, GERD, prediabetes who presents today for follow up of hypertension.   She was last evaluated in late March 2022, was doing well on olmesartan 20 mg and HCTZ 25 mg. Had been undergoing a lot of stress at work for which was thought to be contributing. Given this we agreed for her to start working from home beginning April 4th for 3 months.   Since her last visit she is compliant to her olmesartan 20 mg and HCTZ 25 mg. She continues to experience migraines twice monthly average. She is checking BP at home which is running low 100's/90's. Since working from home she's noticed improvement in overall stress.   Her headaches are located to the mid occipital lobe for which she wakes with in the morning. Sometimes the headaches move to the frontal lobes. She will take Excedrin Migraine with resolve.  She will experience photophobia.   She has noticed blurred vision, has had to wear her glasses more frequently. Underwent an eye exam in January 2022, follows with optometry.    BP Readings from Last 3 Encounters:  01/06/21 (!) 128/92  11/10/20 126/62  10/11/20 (!) 145/100      Review of Systems  Eyes: Positive for visual disturbance.  Respiratory: Negative for shortness of breath.   Cardiovascular: Negative for chest pain.  Neurological: Positive for headaches. Negative for dizziness.         Past Medical History:  Diagnosis Date  . Diverticulitis    November 2018  . Hypertension     Social History   Socioeconomic History  . Marital status: Single    Spouse name: Not on file  . Number of children: Not on file  . Years of education: Not on file  . Highest education level: Not on file  Occupational History  . Not on file  Tobacco Use  . Smoking status: Never Smoker  . Smokeless tobacco:  Never Used  Substance and Sexual Activity  . Alcohol use: No  . Drug use: No  . Sexual activity: Not Currently    Birth control/protection: Surgical  Other Topics Concern  . Not on file  Social History Narrative   Single but soon to be engaged.   Twin girls.   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, environmental   Enjoys relaxing and Tree surgeon   Social Determinants of Corporate investment banker Strain: Not on file  Food Insecurity: Not on file  Transportation Needs: Not on file  Physical Activity: Not on file  Stress: Not on file  Social Connections: Not on file  Intimate Partner Violence: Not on file    Past Surgical History:  Procedure Laterality Date  . ABDOMINAL HYSTERECTOMY     Partial   . APPENDECTOMY    . CHOLECYSTECTOMY    . SMALL INTESTINE SURGERY     November 2018    Family History  Problem Relation Age of Onset  . Diverticulitis Mother   . Hypertension Mother   . Diabetes Father   . CAD Father   . Hypertension Father   . Eczema Father   . Anxiety disorder Father   . Diverticulitis Sister   . Hypertension Sister   . Breast cancer Maternal Aunt     No Known Allergies  Current Outpatient Medications on File  Prior to Visit  Medication Sig Dispense Refill  . aspirin-acetaminophen-caffeine (EXCEDRIN MIGRAINE) 250-250-65 MG tablet Take 2 tablets every 6 (six) hours as needed by mouth for headache or migraine.    . famotidine (PEPCID) 20 MG tablet TAKE 1 TABLET (20 MG TOTAL) BY MOUTH 2 (TWO) TIMES DAILY. FOR HEARTBURN. 60 tablet 5  . hydrochlorothiazide (HYDRODIURIL) 25 MG tablet Take 1 tablet (25 mg total) by mouth daily. For blood pressure. 90 tablet 3  . ibuprofen (ADVIL) 200 MG tablet Take 200 mg by mouth every 6 (six) hours as needed.    Marland Kitchen olmesartan (BENICAR) 20 MG tablet TAKE 1 TABLET (20 MG TOTAL) BY MOUTH DAILY. FOR BLOOD PRESSURE. 30 tablet 5  . [DISCONTINUED] promethazine (PHENERGAN) 12.5 MG tablet Take 1 tablet (12.5 mg total) by mouth every 8 (eight) hours as  needed for nausea or vomiting. 20 tablet 0   No current facility-administered medications on file prior to visit.    BP (!) 128/92   Pulse (!) 102   Temp 97.7 F (36.5 C) (Temporal)   Ht 5\' 11"  (1.803 m)   Wt 284 lb (128.8 kg)   SpO2 100%   BMI 39.61 kg/m  Objective:   Physical Exam Cardiovascular:     Rate and Rhythm: Normal rate and regular rhythm.  Pulmonary:     Effort: Pulmonary effort is normal.  Musculoskeletal:     Cervical back: Neck supple.  Skin:    General: Skin is warm and dry.           Assessment & Plan:      This visit occurred during the SARS-CoV-2 public health emergency.  Safety protocols were in place, including screening questions prior to the visit, additional usage of staff PPE, and extensive cleaning of exam room while observing appropriate contact time as indicated for disinfecting solutions.

## 2021-01-06 NOTE — Patient Instructions (Addendum)
Stop taking olmesartan 20 mg for blood pressure.  Start taking amlodipine 5 mg for blood pressure. Continue taking hydrochlorothiazide 25 mg for blood pressure.  Please schedule a follow up visit to meet back with me in 2-3 weeks for blood pressure check.   It was a pleasure to see you today!

## 2021-01-06 NOTE — Assessment & Plan Note (Addendum)
Diastolic reading above goal in the office today, also with home readings.   Will continue HCTZ 25 mg, switch from olmesartan 20 mg to amlodipine 5 mg. We will see her back in 2-3 weeks for BP check.

## 2021-01-21 ENCOUNTER — Ambulatory Visit
Admission: RE | Admit: 2021-01-21 | Discharge: 2021-01-21 | Disposition: A | Discharge status: Home / Self Care | Payer: BC Managed Care – PPO | Source: Ambulatory Visit | Attending: Primary Care | Admitting: Primary Care

## 2021-01-21 ENCOUNTER — Other Ambulatory Visit: Payer: Self-pay

## 2021-01-21 DIAGNOSIS — Z1231 Encounter for screening mammogram for malignant neoplasm of breast: Secondary | ICD-10-CM

## 2021-01-25 ENCOUNTER — Other Ambulatory Visit: Payer: Self-pay | Admitting: Primary Care

## 2021-01-25 DIAGNOSIS — R928 Other abnormal and inconclusive findings on diagnostic imaging of breast: Secondary | ICD-10-CM

## 2021-01-27 ENCOUNTER — Other Ambulatory Visit: Payer: Self-pay

## 2021-01-27 ENCOUNTER — Ambulatory Visit: Payer: BC Managed Care – PPO | Admitting: Primary Care

## 2021-01-27 ENCOUNTER — Encounter: Payer: Self-pay | Admitting: Primary Care

## 2021-01-27 ENCOUNTER — Telehealth: Payer: Self-pay | Admitting: Primary Care

## 2021-01-27 DIAGNOSIS — G43709 Chronic migraine without aura, not intractable, without status migrainosus: Secondary | ICD-10-CM

## 2021-01-27 DIAGNOSIS — I10 Essential (primary) hypertension: Secondary | ICD-10-CM | POA: Diagnosis not present

## 2021-01-27 DIAGNOSIS — F411 Generalized anxiety disorder: Secondary | ICD-10-CM

## 2021-01-27 MED ORDER — SERTRALINE HCL 50 MG PO TABS: 50.0000 mg | ORAL_TABLET | Freq: Every day | ORAL | 1 refills | Status: DC

## 2021-01-27 NOTE — Assessment & Plan Note (Signed)
Chronic for years, ready for treatment, not ready for therapy.   Rx for Zoloft 50 mg sent to pharmacy. Patient is to take 1/2 tablet daily for 8 days, then advance to 1 full tablet thereafter. We discussed possible side effects of headache, GI upset, drowsiness, and SI/HI. If thoughts of SI/HI develop, we discussed to present to the emergency immediately. Patient verbalized understanding.   Follow up in 6 weeks for re-evaluation.

## 2021-01-27 NOTE — Assessment & Plan Note (Signed)
Improved. Continue to monitor. 

## 2021-01-27 NOTE — Assessment & Plan Note (Signed)
At goal on regimen of HCTZ 25 mg daily and amlodipine 5 mg once weekly.   We discussed for her to continue this regimen for now, but if she began to notice home BP readings at or above 135/90.   She agrees.

## 2021-01-27 NOTE — Telephone Encounter (Signed)
Okay with me. Jodene Nam, can you make the adjustment?

## 2021-01-27 NOTE — Telephone Encounter (Signed)
LVM for pt to call- need return fax #- FMLA paperwork extended and ready

## 2021-01-27 NOTE — Patient Instructions (Signed)
We initiated sertraline (Zoloft) 50 mg daily for anxiety and depression. Start by taking 1/2 tablet daily for about one week, then increase to 1 full tablet thereafter.  Monitor your blood pressure and start the amlodipine 5 mg daily if you see readings consistently at or above 135/90.  Please schedule a follow up visit for 6 weeks for follow up of anxiety/depression.  It was a pleasure to see you today!

## 2021-01-27 NOTE — Telephone Encounter (Signed)
Patient called back asking to extend her FMLA to July 28th  after her appointment with Jae Dire on 03/10/21.

## 2021-01-27 NOTE — Progress Notes (Signed)
Subjective:    Patient ID: [name], female    DOB: [birth year], 45 y.o.   MRN: [id number]  HPI  [name] is a very pleasant 45 y.o. female who presents today for follow up of hypertension.  She was last evaluated on 01/06/21 for follow up of hypertension, was compliant to olmesartan 20 mg and HCTZ 25 mg, continued to experience migraines/headaches. BP was noted to be 128/92, also above with home readings. HCTZ 25 mg was continued, amlodipine 5 mg was added, olmesartan 20 mg was discontinued.   Since her last visit she is compliant to her prescribed regimen of HCTZ 25 mg daily but is taking amlodipine 5 mg once weekly as she's afraid of the potential for ankle edema.  Overall headaches have improved.   She would also like to discuss anxiety symptoms. Symptoms include difficulty sleeping due to mind racing thoughts at night, worry, irritability, thinking worst case scenario. She is on FMLA continuously now for workplace stress. Anxiety symptoms have been chronic for years.   BP Readings from Last 3 Encounters:  01/27/21 120/82  01/06/21 (!) 128/92  11/10/20 126/62        Review of Systems  Respiratory:  Negative for shortness of breath.   Cardiovascular:  Negative for chest pain and leg swelling.  Psychiatric/Behavioral:  Positive for sleep disturbance. The patient is nervous/anxious.         Past Medical History:  Diagnosis Date   Diverticulitis    November 2018   Hypertension     Social History   Socioeconomic History   Marital status: Single    Spouse name: Not on file   Number of children: Not on file   Years of education: Not on file   Highest education level: Not on file  Occupational History   Not on file  Tobacco Use   Smoking status: Never   Smokeless tobacco: Never  Substance and Sexual Activity   Alcohol use: No   Drug use: No   Sexual activity: Not Currently    Birth control/protection: Surgical  Other Topics Concern   Not on file  Social  History Narrative   Single but soon to be engaged.   Twin girls.   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   Enjoys relaxing and Tree surgeon   Social Determinants of Corporate investment banker Strain: Not on file  Food Insecurity: Not on file  Transportation Needs: Not on file  Physical Activity: Not on file  Stress: Not on file  Social Connections: Not on file  Intimate Partner Violence: Not on file    Past Surgical History:  Procedure Laterality Date   ABDOMINAL HYSTERECTOMY     Partial    APPENDECTOMY     CHOLECYSTECTOMY     SMALL INTESTINE SURGERY     November 2018    Family History  Problem Relation Age of Onset   Diverticulitis Mother    Hypertension Mother    Diabetes Father    CAD Father    Hypertension Father    Eczema Father    Anxiety disorder Father    Diverticulitis Sister    Hypertension Sister    Breast cancer Maternal Aunt     No Known Allergies  Current Outpatient Medications on File Prior to Visit  Medication Sig Dispense Refill   amLODipine (NORVASC) 5 MG tablet Take 1 tablet (5 mg total) by mouth daily. For blood pressure. (Patient taking differently: Take 5 mg by mouth once a week.  For blood pressure. Takes about once a week.) 30 tablet 0   aspirin-acetaminophen-caffeine (EXCEDRIN MIGRAINE) 250-250-65 MG tablet Take 2 tablets every 6 (six) hours as needed by mouth for headache or migraine.     famotidine (PEPCID) 20 MG tablet TAKE 1 TABLET (20 MG TOTAL) BY MOUTH 2 (TWO) TIMES DAILY. FOR HEARTBURN. 60 tablet 5   hydrochlorothiazide (HYDRODIURIL) 25 MG tablet Take 1 tablet (25 mg total) by mouth daily. For blood pressure. 90 tablet 3   ibuprofen (ADVIL) 200 MG tablet Take 200 mg by mouth every 6 (six) hours as needed.     [DISCONTINUED] promethazine (PHENERGAN) 12.5 MG tablet Take 1 tablet (12.5 mg total) by mouth every 8 (eight) hours as needed for nausea or vomiting. 20 tablet 0   No current facility-administered medications on file prior to visit.     BP 120/82 (BP Location: Left Arm, Patient Position: Sitting, Cuff Size: Large)   Pulse 72   Temp 98 F (36.7 C) (Temporal)   Ht 5\' 11"  (1.803 m)   Wt 286 lb (129.7 kg)   BMI 39.89 kg/m  Objective:   Physical Exam Cardiovascular:     Rate and Rhythm: Normal rate and regular rhythm.     Comments: No lower extremity edema noted Pulmonary:     Effort: Pulmonary effort is normal.     Breath sounds: Normal breath sounds.  Musculoskeletal:     Cervical back: Neck supple.  Skin:    General: Skin is warm and dry.          Assessment & Plan:      This visit occurred during the SARS-CoV-2 public health emergency.  Safety protocols were in place, including screening questions prior to the visit, additional usage of staff PPE, and extensive cleaning of exam room while observing appropriate contact time as indicated for disinfecting solutions.

## 2021-01-28 ENCOUNTER — Other Ambulatory Visit: Payer: Self-pay | Admitting: Primary Care

## 2021-01-28 ENCOUNTER — Ambulatory Visit
Admission: RE | Admit: 2021-01-28 | Discharge: 2021-01-28 | Disposition: A | Discharge status: Home / Self Care | Payer: BC Managed Care – PPO | Source: Ambulatory Visit | Attending: Primary Care | Admitting: Primary Care

## 2021-01-28 DIAGNOSIS — R928 Other abnormal and inconclusive findings on diagnostic imaging of breast: Secondary | ICD-10-CM

## 2021-01-28 DIAGNOSIS — I10 Essential (primary) hypertension: Secondary | ICD-10-CM

## 2021-01-28 NOTE — Telephone Encounter (Signed)
Left message to return call to our office.  

## 2021-01-28 NOTE — Telephone Encounter (Signed)
Will you check with patient to see if she actually needs a refill? I just saw her yesterday and she didn't mention needing one.

## 2021-01-28 NOTE — Telephone Encounter (Signed)
Pt informed that date was amended on paperwork to 03/11/2021 and that paperwork was completed and faxed  Copy mailed to pt  Copy for scan   Copy retained by me

## 2021-01-28 NOTE — Telephone Encounter (Signed)
Patient called back and states she does not need a refill on this at this time. I have declined the request. FYI to PCP

## 2021-01-29 NOTE — Telephone Encounter (Signed)
Noted, completed and placed on Tamera's desk.

## 2021-01-29 NOTE — Telephone Encounter (Signed)
Pt called and requested we amend her return to work date to 02/18/2021 instead of 03/11/2021  Paperwork updated and placed in PCP's inbox for review, initial, sign and date

## 2021-02-01 NOTE — Telephone Encounter (Signed)
Lvm for pt that amended paperwork has been faxed  Copy mailed to pt  Copy for scan   Copy retained by me

## 2021-02-10 ENCOUNTER — Other Ambulatory Visit: Payer: Self-pay | Admitting: Primary Care

## 2021-02-10 DIAGNOSIS — F411 Generalized anxiety disorder: Secondary | ICD-10-CM

## 2021-02-11 NOTE — Telephone Encounter (Signed)
Left v/m that she is to return to work next week due to info off FMLA document. Pt said she needs form requested called sickness for duty certification. Pt request cb when this form is faxed.sending note to [employer].

## 2021-02-11 NOTE — Telephone Encounter (Signed)
Called and spoke with patient.  Patient is going to contact [facility] rep and have paperwork faxed to clinic for completion.  I informed pt I would be out of office until 02/17/2021

## 2021-02-17 NOTE — Telephone Encounter (Signed)
Is she already back to work? Rena's phone note was sent on 06/30 stating that she wanted to return "next week" which is now. What date does she want to return?

## 2021-02-17 NOTE — Telephone Encounter (Signed)
Pt notified paperwork is completed and ready for pick up  Copy for pt pick up  Copy for scan   Copy retained by me

## 2021-02-17 NOTE — Telephone Encounter (Signed)
Patient called stating she has the paperwork that needs to be filled out and needs this sent to Human resources at her work by tomorrow please. She is emaling this to our office email and asked to please review as soon as possible.   Irving Burton please pull and give to Jodene Nam as soon as you can.

## 2021-02-17 NOTE — Telephone Encounter (Signed)
Completed form and handed to [place].

## 2021-02-17 NOTE — Telephone Encounter (Signed)
Return to work paperwork placed in PCP's inbox for completion, sign and date

## 2021-03-08 ENCOUNTER — Other Ambulatory Visit: Payer: Self-pay | Admitting: Primary Care

## 2021-03-08 DIAGNOSIS — I10 Essential (primary) hypertension: Secondary | ICD-10-CM

## 2021-03-10 ENCOUNTER — Ambulatory Visit: Payer: BC Managed Care – PPO | Admitting: Primary Care

## 2021-04-03 ENCOUNTER — Other Ambulatory Visit: Payer: Self-pay | Admitting: Primary Care

## 2021-04-03 DIAGNOSIS — K219 Gastro-esophageal reflux disease without esophagitis: Secondary | ICD-10-CM

## 2021-04-25 ENCOUNTER — Other Ambulatory Visit: Payer: Self-pay

## 2021-04-25 ENCOUNTER — Ambulatory Visit (HOSPITAL_COMMUNITY)
Admission: EM | Admit: 2021-04-25 | Discharge: 2021-04-25 | Discharge status: Against Medical Advice | Payer: BC Managed Care – PPO

## 2021-04-25 DIAGNOSIS — R220 Localized swelling, mass and lump, head: Secondary | ICD-10-CM | POA: Diagnosis not present

## 2021-04-25 DIAGNOSIS — Z5321 Procedure and treatment not carried out due to patient leaving prior to being seen by health care provider: Secondary | ICD-10-CM

## 2021-04-25 NOTE — ED Provider Notes (Signed)
Left without being seen.   Rhys Martini, PA-C 04/25/21 1547

## 2021-04-25 NOTE — ED Notes (Signed)
Called pt to room, no answer

## 2021-04-25 NOTE — ED Notes (Signed)
Called pt for rooming - no answer

## 2021-04-26 ENCOUNTER — Telehealth: Payer: Self-pay | Admitting: Primary Care

## 2021-04-26 DIAGNOSIS — I10 Essential (primary) hypertension: Secondary | ICD-10-CM

## 2021-04-26 NOTE — Telephone Encounter (Signed)
Pt called she needs a refill on hydrochlorothiazide (HYDRODIURIL) 25 MG tablet sent to [facility]  She was out of town and left her medication there

## 2021-04-27 MED ORDER — HYDROCHLOROTHIAZIDE 25 MG PO TABS: 25.0000 mg | ORAL_TABLET | Freq: Every day | ORAL | 2 refills | Status: DC

## 2021-04-27 NOTE — Addendum Note (Signed)
Addended by: [name] on: 04/27/2021 09:01 AM   Modules accepted: Orders

## 2021-04-27 NOTE — Telephone Encounter (Signed)
Noted. I sent refills to [facility] in [place], not sure if that was the correct one.

## 2021-05-04 ENCOUNTER — Other Ambulatory Visit: Payer: Self-pay

## 2021-05-04 DIAGNOSIS — K219 Gastro-esophageal reflux disease without esophagitis: Secondary | ICD-10-CM

## 2021-05-04 NOTE — Telephone Encounter (Signed)
Patient requests refill on: 05/04/21   Famotidine 20mg  1 po bid  LAST REFILL: 04/03/21, #180, 1 refill  LAST OV: 01/27/21 NEXT OV: none scheduled PHARMACY: [facility] [place]   NOTE: Patient states she has lost her bottle and is without medication.  Requests for this to be sent in today as she has already missed a day without medication.  I let patient know that insurance may not cover the refill but I will forward on to CMA and provider.   Thanks.

## 2021-05-05 MED ORDER — FAMOTIDINE 20 MG PO TABS: 20.0000 mg | ORAL_TABLET | Freq: Two times a day (BID) | ORAL | 1 refills | Status: DC

## 2021-05-05 NOTE — Telephone Encounter (Signed)
Refills sent to pharmacy. 

## 2021-06-22 NOTE — Telephone Encounter (Signed)
Pt has not received medication yet and it has been a month

## 2021-06-23 NOTE — Telephone Encounter (Signed)
Pt called checking on status of her medication hydrochlorothiazide (HYDRODIURIL) 25 MG tablet. I told pt that it was confirmed by pharmacy on 04/27/21. Pt is asking if you could email her the confirmation receipt at [email or web address].

## 2021-06-24 NOTE — Telephone Encounter (Signed)
Left message to return call to our office.  Looks like she should have refills at pharmacy.

## 2021-07-01 ENCOUNTER — Telehealth: Payer: BC Managed Care – PPO | Admitting: Family Medicine

## 2021-07-01 DIAGNOSIS — R319 Hematuria, unspecified: Secondary | ICD-10-CM | POA: Diagnosis not present

## 2021-07-01 DIAGNOSIS — N390 Urinary tract infection, site not specified: Secondary | ICD-10-CM | POA: Diagnosis not present

## 2021-07-06 NOTE — Telephone Encounter (Signed)
Left message asking patient to call back to let us know if she is still needing refills or any other issues she was having with her medications that needed to be addressed

## 2021-07-12 NOTE — Telephone Encounter (Signed)
Have called patient x 3 no call back  

## 2021-07-13 NOTE — Telephone Encounter (Signed)
Patient has plenty of refills through her pharmacy, needs to contact pharmacy.

## 2021-08-03 ENCOUNTER — Other Ambulatory Visit: Payer: BC Managed Care – PPO

## 2021-08-07 ENCOUNTER — Other Ambulatory Visit: Payer: Self-pay | Admitting: Primary Care

## 2021-08-07 DIAGNOSIS — I10 Essential (primary) hypertension: Secondary | ICD-10-CM

## 2021-09-13 DIAGNOSIS — H0011 Chalazion right upper eyelid: Secondary | ICD-10-CM | POA: Diagnosis not present

## 2021-09-13 DIAGNOSIS — H1045 Other chronic allergic conjunctivitis: Secondary | ICD-10-CM | POA: Diagnosis not present

## 2021-09-16 ENCOUNTER — Ambulatory Visit: Payer: BC Managed Care – PPO | Admitting: Nurse Practitioner

## 2021-11-10 ENCOUNTER — Ambulatory Visit (HOSPITAL_COMMUNITY)
Admission: EM | Admit: 2021-11-10 | Discharge: 2021-11-10 | Disposition: A | Discharge status: Home / Self Care | Payer: BC Managed Care – PPO | Attending: Emergency Medicine | Admitting: Emergency Medicine

## 2021-11-10 ENCOUNTER — Encounter (HOSPITAL_COMMUNITY): Payer: Self-pay

## 2021-11-10 DIAGNOSIS — H00025 Hordeolum internum left lower eyelid: Secondary | ICD-10-CM | POA: Diagnosis not present

## 2021-11-10 MED ORDER — CEFDINIR 300 MG PO CAPS: 300.0000 mg | ORAL_CAPSULE | Freq: Two times a day (BID) | ORAL | 0 refills | Status: AC

## 2021-11-10 MED ORDER — MOXIFLOXACIN HCL 0.5 % OP SOLN: 1.0000 [drp] | Freq: Three times a day (TID) | OPHTHALMIC | 0 refills | Status: AC

## 2021-11-10 MED ORDER — HYDRALAZINE HCL 25 MG PO TABS: 25.0000 mg | ORAL_TABLET | Freq: Three times a day (TID) | ORAL | 0 refills | Status: DC

## 2021-11-10 NOTE — ED Provider Notes (Signed)
?[place] ? ? ? ?CSN: [id number] ?Arrival date & time: 11/10/21  1925 ?  ? ?HISTORY  ? ?Chief Complaint  ?Patient presents with  ? Eye Problem  ? ?HPI ?[name] is a 46 y.o. female. Patient presents to urgent care complaining of a red lesion on the inside of her left oral eyelid for the past 5 days.  Patient states initially she does have pain below her eye and then a few days ago noticed the red lesion.  Patient states sometimes her vision feels blurry in her left eye but otherwise she is not having any pain in her eye, just in the soft tissue below her eye.  Patient's blood pressure is very elevated today.  Patient states she thinks under increased rest at work but also has small children at home that also cause her a significant amount of stress.  Patient states she is never seen her blood pressure this high before.  Patient states she is consistent with taking her blood pressure medications, amlodipine and hydrochlorothiazide.  Patient states she took both today. ? ? ?Past Medical History:  ?Diagnosis Date  ? Diverticulitis   ? November 2018  ? Hypertension   ? ?Patient Active Problem List  ? Diagnosis Date Noted  ? GAD (generalized anxiety disorder) 01/27/2021  ? Stressful workplace 11/10/2020  ? Cough 09/18/2020  ? Gastroesophageal reflux disease 07/22/2020  ? Prediabetes 09/21/2018  ? Palpitations 09/21/2018  ? Encounter for annual general medical examination with abnormal findings in adult 09/21/2018  ? Pedal edema 04/04/2018  ? Migraines 09/07/2017  ? Essential hypertension 09/07/2017  ? ?Past Surgical History:  ?Procedure Laterality Date  ? ABDOMINAL HYSTERECTOMY    ? Partial   ? APPENDECTOMY    ? CHOLECYSTECTOMY    ? SMALL INTESTINE SURGERY    ? November 2018  ? ?OB History   ?No obstetric history on file. ?  ? ?Home Medications   ? ?Prior to Admission medications   ?Medication Sig Start Date End Date Taking? Authorizing Provider  ?amLODipine (NORVASC) 5 MG tablet TAKE 1 TABLET BY MOUTH  EVERY DAY FOR BLOOD PRESSURE 08/09/21   Pleas Koch, NP  ?aspirin-acetaminophen-caffeine (EXCEDRIN MIGRAINE) [phone] MG tablet Take 2 tablets every 6 (six) hours as needed by mouth for headache or migraine.    [provider]  ?famotidine (PEPCID) 20 MG tablet Take 1 tablet (20 mg total) by mouth 2 (two) times daily. For heartburn. 05/05/21   Pleas Koch, NP  ?hydrochlorothiazide (HYDRODIURIL) 25 MG tablet Take 1 tablet (25 mg total) by mouth daily. For blood pressure. 04/27/21   Pleas Koch, NP  ?ibuprofen (ADVIL) 200 MG tablet Take 200 mg by mouth every 6 (six) hours as needed.    [provider]  ?sertraline (ZOLOFT) 50 MG tablet Take 1 tablet (50 mg total) by mouth daily. For anxiety 01/27/21   Pleas Koch, NP  ?promethazine (PHENERGAN) 12.5 MG tablet Take 1 tablet (12.5 mg total) by mouth every 8 (eight) hours as needed for nausea or vomiting. 02/01/19 06/17/20  Pleas Koch, NP  ? ?Family History ?Family History  ?Problem Relation Age of Onset  ? Diverticulitis Mother   ? Hypertension Mother   ? Diabetes Father   ? CAD Father   ? Hypertension Father   ? Eczema Father   ? Anxiety disorder Father   ? Diverticulitis Sister   ? Hypertension Sister   ? Breast cancer Maternal Aunt   ? ?Social History ?  Social History  ? ?Tobacco Use  ? Smoking status: Never  ? Smokeless tobacco: Never  ?Substance Use Topics  ? Alcohol use: No  ? Drug use: No  ? ?Allergies   ?Patient has no known allergies. ? ?Review of Systems ?Review of Systems ?Pertinent findings noted in history of present illness.  ? ?Physical Exam ?Triage Vital Signs ?ED Triage Vitals  ?Enc Vitals Group  ?   BP 06/11/21 0827 (!) 147/82  ?   Pulse Rate 06/11/21 0827 72  ?   Resp 06/11/21 0827 18  ?   Temp 06/11/21 0827 98.3 ?F (36.8 ?C)  ?   Temp Source 06/11/21 0827 Oral  ?   SpO2 06/11/21 0827 98 %  ?   Weight --   ?   Height --   ?   Head Circumference --   ?   Peak Flow --   ?   Pain Score 06/11/21 0826 5  ?    Pain Loc --   ?   Pain Edu? --   ?   Excl. in [place]? --   ?No data found. ? ?Updated Vital Signs ?BP (!) 172/113 (BP Location: Right Arm)   Pulse 88   Resp 16   SpO2 98%  ? ?Physical Exam ?Vitals and nursing note reviewed.  ?Constitutional:   ?   General: She is not in acute distress. ?   Appearance: Normal appearance. She is not ill-appearing.  ?HENT:  ?   Head: Normocephalic and atraumatic.  ?Eyes:  ?   General:     ?   Right eye: No foreign body, discharge or hordeolum.     ?   Left eye: Hordeolum (Medial left lower lid) present.No foreign body or discharge.  ?   Extraocular Movements: Extraocular movements intact.  ?   Right eye: Normal extraocular motion.  ?   Left eye: Normal extraocular motion.  ?   Conjunctiva/sclera: Conjunctivae normal.  ?   Right eye: Right conjunctiva is not injected.  ?   Left eye: Left conjunctiva is not injected.  ?   Comments: Patient has significant lower lid swelling down to top of cheekbone that is tender to palpation.  ?Neck:  ?   Trachea: Trachea and phonation normal.  ?Cardiovascular:  ?   Rate and Rhythm: Normal rate and regular rhythm.  ?   Pulses: Normal pulses.  ?   Heart sounds: Normal heart sounds. No murmur heard. ?  No friction rub. No gallop.  ?Pulmonary:  ?   Effort: Pulmonary effort is normal. No accessory muscle usage, prolonged expiration or respiratory distress.  ?   Breath sounds: Normal breath sounds. No stridor, decreased air movement or transmitted upper airway sounds. No decreased breath sounds, wheezing, rhonchi or rales.  ?Chest:  ?   Chest wall: No tenderness.  ?Musculoskeletal:     ?   General: Normal range of motion.  ?   Cervical back: Normal range of motion and neck supple. Normal range of motion.  ?Lymphadenopathy:  ?   Cervical: No cervical adenopathy.  ?Skin: ?   General: Skin is warm and dry.  ?   Findings: No erythema or rash.  ?Neurological:  ?   General: No focal deficit present.  ?   Mental Status: She is alert and oriented to person, place,  and time.  ?Psychiatric:     ?   Mood and Affect: Mood normal.     ?   Behavior: Behavior normal.  ? ? ?  Visual Acuity ?Right Eye Distance:   ?Left Eye Distance:   ?Bilateral Distance:   ? ?Right Eye Near:   ?Left Eye Near:    ?Bilateral Near:    ? ?UC Couse / Diagnostics / Procedures:  ?  ?EKG ? ?Radiology ?No results found. ? ?Procedures ?Procedures (including critical care time) ? ?UC Diagnoses / Final Clinical Impressions(s)   ?I have reviewed the triage vital signs and the nursing notes. ? ?Pertinent labs & imaging results that were available during my care of the patient were reviewed by me and considered in my medical decision making (see chart for details).   ?Final diagnoses:  ?Hordeolum internum of left lower eyelid  ?Based on patient's history and physical exam findings, I believe the patient may have preseptal cellulitis secondary to hordeolum.  Patient provided with a prescription for cefdinir given low concern for MRSA infection along with moxifloxacin eyedrops to hasten healing of the hordeolum.  Patient also advised to apply warm moist compresses to the eye 4 times daily. ? ?For patient's blood pressure, I provided her with a prescription for hydralazine that she can take up to 3 times daily as needed for blood pressure greater than [id number] mmHg systolic.  Patient advised to follow-up with her primary care provider regarding this prescription and her increased blood pressure. ?ED Prescriptions   ? ? Medication Sig Dispense Auth. Provider  ? hydrALAZINE (APRESOLINE) 25 MG tablet Take 1 tablet (25 mg total) by mouth 3 (three) times daily. 90 tablet Lynden Oxford Scales, PA-C  ? cefdinir (OMNICEF) 300 MG capsule Take 1 capsule (300 mg total) by mouth 2 (two) times daily for 7 days. 14 capsule Lynden Oxford Scales, PA-C  ? moxifloxacin (VIGAMOX) 0.5 % ophthalmic solution Place 1 drop into the left eye 3 (three) times daily for 7 days. 3 mL Lynden Oxford Scales, PA-C  ? ?  ? ?PDMP not reviewed this  encounter. ? ?Pending results:  ?Labs Reviewed - No data to display ? ?Medications Ordered in UC: ?Medications - No data to display ? ?Disposition Upon Discharge:  ?Condition: stable for discharge home ?Home:

## 2021-11-10 NOTE — Discharge Instructions (Addendum)
I stand corrected.  The lesion in your eye is actually a hordeolum, not a chalazion.  Either way, you have a stye in your eye and we need to treat it. ? ?Please begin taking cefdinir daily for 7 days.  Please begin using moxifloxacin eyedrops, 1 drop in your left eye 3 times daily for 7 days.  If you do not see significant improvement of your symptoms after 48 hours, please either see an ophthalmologist or go to the emergency room for further evaluation. ? ?You can also apply warm, moist heat to your eye 3-4 times daily to help expedite the healing process. ? ?For your blood pressure, you can take hydralazine 25 mg 3 times daily as needed for any blood pressure greater than 140 mmHg systolic. ? ?Thank you for visiting urgent care today.  Please let us know if there is anything else we can do to help. ? ? ?

## 2021-11-10 NOTE — ED Triage Notes (Signed)
Pt presents with c/o L eye pain X 5 days.   ? ?Pt states she is having blurred vision, no pain.  ?

## 2021-12-14 ENCOUNTER — Ambulatory Visit: Payer: BC Managed Care – PPO | Admitting: Family Medicine

## 2021-12-14 ENCOUNTER — Telehealth: Payer: Self-pay | Admitting: *Deleted

## 2021-12-14 ENCOUNTER — Encounter: Payer: Self-pay | Admitting: Family Medicine

## 2021-12-14 VITALS — BP 134/82 | HR 92 | Temp 97.9°F | Ht 71.0 in | Wt 286.2 lb

## 2021-12-14 DIAGNOSIS — I10 Essential (primary) hypertension: Secondary | ICD-10-CM | POA: Diagnosis not present

## 2021-12-14 DIAGNOSIS — R600 Localized edema: Secondary | ICD-10-CM | POA: Diagnosis not present

## 2021-12-14 NOTE — Telephone Encounter (Signed)
Pt saw Dr. Milinda Antis for an acute foot swelling appt. She gave me a BMI form she wanted filled out. I advised pt that her PCP has to fill that out. I showed Joellen the form and since pt hasn't had recent labs or has been seen this year she advised me a f/u or CPE is needed. I did advise pt and gave her forms back to her and advised her to bring them when she sees her PCP. Pt scheduled an appt on the way out with PCP on 12/30/21 ? ?FYI to PCP ?

## 2021-12-14 NOTE — Patient Instructions (Signed)
Keep watching sodium  ?Keep drinking water  ? ?If you can , elevate feet when you sit  ? ?Look for some compression knee high socks to try for the swelling. Wear during the day but not at night  ? ?Labs today  ? ?Then I will review with Anda Kraft ?May need to consider medicine changes  ? ? ? ? ?

## 2021-12-14 NOTE — Assessment & Plan Note (Signed)
Acute on chronic - worsened this weekend  ?Uncomfortable and shoes do not fit  ?Has inc water, dec sodium without improvement  ?No assoc symptoms and no skin changes  ? ?This follows addn of hydralazine for bp and warmer weather (takes nsaid sparingly as well) ?Suspect this and amlodipine may add to edema  ?Lab today for cmet  ?Then d/w pcp re: if need to change bp med  ? ?

## 2021-12-14 NOTE — Assessment & Plan Note (Signed)
Has been well controlled since adding hydralazine in ER in march ? ?BP: 134/82  ? ?hctz 25 mg daily  ?Amlodipine 5 mg daily  ?Hydralazine 25 mg tid (some days bid)  ?She has developed worse pedal edema now- ? If due to the hydralazine ?Will d/w pcp  ?Enc her to continue good water intake and cutting sodium  ?

## 2021-12-14 NOTE — Progress Notes (Signed)
? ?Subjective:  ? ? Patient ID: [name], female    DOB: 1975/10/17, 46 y.o.   MRN: [id number] ? ?HPI ?46 yo pt of NP Clark presents with feet swelling  ? ?Wt Readings from Last 3 Encounters:  ?12/14/21 286 lb 4 oz (129.8 kg)  ?01/27/21 286 lb (129.7 kg)  ?01/06/21 284 lb (128.8 kg)  ? ?39.92 kg/m? ? ?History of pedal edema with HTN and obesity  ? ?Noticed inc foot swelling worse this weekend  ?She increased her water intake -no help ?Watches her sodium also  ? ?Hard time fitting into her own shoes due to swelling  ?Uncomfortable  ?Not painful  ? ?She has to sit with feet down  ?Stands occasionally  ? ?Has not tried support stockings in the past  ? ?No sob  ?Gets chest discomfort if anxious in the past  ?Breathing exercises stop it  ? ? ? ? ?Lab Results  ?Component Value Date  ? CREATININE 0.97 07/22/2020  ? BUN 11 07/22/2020  ? NA 137 07/22/2020  ? K 3.5 07/22/2020  ? CL 99 07/22/2020  ? CO2 32 07/22/2020  ? ?Lab Results  ?Component Value Date  ? ALT 18 07/22/2020  ? AST 26 07/22/2020  ? ALKPHOS 25 (L) 07/22/2020  ? BILITOT 0.3 07/22/2020  ? ?Advil is on her med list  ? ? ?H/o HTN ? ?BP Readings from Last 3 Encounters:  ?12/14/21 134/82  ?11/10/21 (!) 172/113  ?01/27/21 120/82  ? ?Takes hctz 25 mg daily  ?Amlodipine 5 mg weekly  ?Hydralazine 25 mg tid  (this was added when in ER on 11/10/21 for hordeolum and bp was high  ? ?Hard to remember 3 times daily - sometimes remembers twice daily  ? ?Patient Active Problem List  ? Diagnosis Date Noted  ? GAD (generalized anxiety disorder) 01/27/2021  ? Stressful workplace 11/10/2020  ? Cough 09/18/2020  ? Gastroesophageal reflux disease 07/22/2020  ? Prediabetes 09/21/2018  ? Palpitations 09/21/2018  ? Encounter for annual general medical examination with abnormal findings in adult 09/21/2018  ? Pedal edema 04/04/2018  ? Migraines 09/07/2017  ? Essential hypertension 09/07/2017  ? ?Past Medical History:  ?Diagnosis Date  ? Diverticulitis   ? November 2018  ? Hypertension    ? ?Past Surgical History:  ?Procedure Laterality Date  ? ABDOMINAL HYSTERECTOMY    ? Partial   ? APPENDECTOMY    ? CHOLECYSTECTOMY    ? SMALL INTESTINE SURGERY    ? November 2018  ? ?Social History  ? ?Tobacco Use  ? Smoking status: Never  ? Smokeless tobacco: Never  ?Substance Use Topics  ? Alcohol use: No  ? Drug use: No  ? ?Family History  ?Problem Relation Age of Onset  ? Diverticulitis Mother   ? Hypertension Mother   ? Diabetes Father   ? CAD Father   ? Hypertension Father   ? Eczema Father   ? Anxiety disorder Father   ? Diverticulitis Sister   ? Hypertension Sister   ? Breast cancer Maternal Aunt   ? ?No Known Allergies ?Current Outpatient Medications on File Prior to Visit  ?Medication Sig Dispense Refill  ? amLODipine (NORVASC) 5 MG tablet TAKE 1 TABLET BY MOUTH EVERY DAY FOR BLOOD PRESSURE 90 tablet 0  ? aspirin-acetaminophen-caffeine (EXCEDRIN MIGRAINE) 250-250-65 MG tablet Take 2 tablets every 6 (six) hours as needed by mouth for headache or migraine.    ? famotidine (PEPCID) 20 MG tablet Take 1 tablet (20 mg total) by  mouth 2 (two) times daily. For heartburn. 180 tablet 1  ? hydrochlorothiazide (HYDRODIURIL) 25 MG tablet Take 1 tablet (25 mg total) by mouth daily. For blood pressure. 90 tablet 2  ? ibuprofen (ADVIL) 200 MG tablet Take 200 mg by mouth every 6 (six) hours as needed.    ? hydrALAZINE (APRESOLINE) 25 MG tablet Take 1 tablet (25 mg total) by mouth 3 (three) times daily. 90 tablet 0  ? ?No current facility-administered medications on file prior to visit.  ?  ? ?Review of Systems  ?Constitutional:  Negative for activity change, appetite change, fatigue, fever and unexpected weight change.  ?HENT:  Negative for congestion, ear pain, rhinorrhea, sinus pressure and sore throat.   ?Eyes:  Negative for pain, redness and visual disturbance.  ?Respiratory:  Negative for cough, shortness of breath and wheezing.   ?Cardiovascular:  Positive for leg swelling. Negative for chest pain and palpitations.   ?Gastrointestinal:  Negative for abdominal pain, blood in stool, constipation and diarrhea.  ?Endocrine: Negative for polydipsia and polyuria.  ?Genitourinary:  Negative for dysuria, frequency and urgency.  ?Musculoskeletal:  Negative for arthralgias, back pain and myalgias.  ?Skin:  Negative for pallor and rash.  ?Allergic/Immunologic: Negative for environmental allergies.  ?Neurological:  Negative for dizziness, syncope and headaches.  ?Hematological:  Negative for adenopathy. Does not bruise/bleed easily.  ?Psychiatric/Behavioral:  Negative for decreased concentration and dysphoric mood. The patient is not nervous/anxious.   ?     High stress level   ? ?   ?Objective:  ? Physical Exam ?Constitutional:   ?   General: She is not in acute distress. ?   Appearance: Normal appearance. She is well-developed. She is obese. She is not ill-appearing or diaphoretic.  ?HENT:  ?   Head: Normocephalic and atraumatic.  ?Eyes:  ?   General: No scleral icterus. ?   Conjunctiva/sclera: Conjunctivae normal.  ?   Pupils: Pupils are equal, round, and reactive to light.  ?Neck:  ?   Thyroid: No thyromegaly.  ?   Vascular: No carotid bruit or JVD.  ?Cardiovascular:  ?   Rate and Rhythm: Normal rate and regular rhythm.  ?   Heart sounds: Normal heart sounds.  ?  No gallop.  ?Pulmonary:  ?   Effort: Pulmonary effort is normal. No respiratory distress.  ?   Breath sounds: Normal breath sounds. No wheezing or rales.  ?Abdominal:  ?   General: There is no distension or abdominal bruit.  ?   Palpations: Abdomen is soft.  ?Musculoskeletal:  ?   Cervical back: Normal range of motion and neck supple.  ?   Right lower leg: Edema present.  ?   Left lower leg: Edema present.  ?   Comments: Trace to one plus pedal edema to the ankle  ?No palpable cords, tenderness or warmth ?No varicosities   ?Lymphadenopathy:  ?   Cervical: No cervical adenopathy.  ?Skin: ?   General: Skin is warm and dry.  ?   Coloration: Skin is not jaundiced or pale.  ?    Findings: No bruising or rash.  ?Neurological:  ?   Mental Status: She is alert.  ?   Coordination: Coordination normal.  ?   Deep Tendon Reflexes: Reflexes are normal and symmetric. Reflexes normal.  ?Psychiatric:     ?   Mood and Affect: Mood normal.  ? ? ? ? ? ?   ?Assessment & Plan:  ? ?Problem List Items Addressed This Visit   ? ?  ?  Cardiovascular and Mediastinum  ? Essential hypertension  ?  Has been well controlled since adding hydralazine in ER in march ? ?BP: 134/82  ? ?hctz 25 mg daily  ?Amlodipine 5 mg daily  ?Hydralazine 25 mg tid (some days bid)  ?She has developed worse pedal edema now- ? If due to the hydralazine ?Will d/w pcp  ?Enc her to continue good water intake and cutting sodium  ? ?  ?  ? Relevant Orders  ? Comprehensive metabolic panel  ?  ? Other  ? Pedal edema - Primary  ?  Acute on chronic - worsened this weekend  ?Uncomfortable and shoes do not fit  ?Has inc water, dec sodium without improvement  ?No assoc symptoms and no skin changes  ? ?This follows addn of hydralazine for bp and warmer weather (takes nsaid sparingly as well) ?Suspect this and amlodipine may add to edema  ?Lab today for cmet  ?Then d/w pcp re: if need to change bp med  ? ? ?  ?  ? Relevant Orders  ? Comprehensive metabolic panel  ? ? ?

## 2021-12-15 LAB — COMPREHENSIVE METABOLIC PANEL
ALT: 24 U/L (ref 0–35)
AST: 36 U/L (ref 0–37)
Albumin: 3.6 g/dL (ref 3.5–5.2)
Alkaline Phosphatase: 25 U/L — ABNORMAL LOW (ref 39–117)
BUN: 12 mg/dL (ref 6–23)
CO2: 31 mEq/L (ref 19–32)
Calcium: 9.1 mg/dL (ref 8.4–10.5)
Chloride: 102 mEq/L (ref 96–112)
Creatinine, Ser: 0.97 mg/dL (ref 0.40–1.20)
GFR: 71.31 mL/min (ref >60.00)
Glucose, Bld: 100 mg/dL — ABNORMAL HIGH (ref 70–99)
Potassium: 3.7 mEq/L (ref 3.5–5.1)
Sodium: 139 mEq/L (ref 135–145)
Total Bilirubin: 0.3 mg/dL (ref 0.2–1.2)
Total Protein: 8.6 g/dL — ABNORMAL HIGH (ref 6.0–8.3)

## 2021-12-15 NOTE — Telephone Encounter (Signed)
Noted, will evaluate as scheduled.  

## 2021-12-20 DIAGNOSIS — J029 Acute pharyngitis, unspecified: Secondary | ICD-10-CM | POA: Diagnosis not present

## 2021-12-20 DIAGNOSIS — I159 Secondary hypertension, unspecified: Secondary | ICD-10-CM | POA: Diagnosis not present

## 2021-12-20 DIAGNOSIS — H5789 Other specified disorders of eye and adnexa: Secondary | ICD-10-CM | POA: Diagnosis not present

## 2021-12-30 ENCOUNTER — Ambulatory Visit: Payer: BC Managed Care – PPO | Admitting: Primary Care

## 2022-01-18 ENCOUNTER — Ambulatory Visit: Payer: BC Managed Care – PPO | Admitting: Primary Care

## 2022-01-18 ENCOUNTER — Telehealth: Payer: Self-pay

## 2022-01-18 NOTE — Telephone Encounter (Signed)
[facility] Night - Client Nonclinical Telephone Record  [facility] Client [facility] Night - Client Client Site [facility] - Night Provider Vernona Rieger - NP Contact Type Call Who Is Calling Patient / Member / Family / Caregiver Caller Name [name] Phone Number [phone] Patient Name [name] Patient DOB 07/03/1976 Call Type Message Only Information Provided Reason for Call Request to Reschedule Office Appointment Initial Comment Caller states that she needs to reschedule an appointment. Patient request to speak to RN No Disp. Time Disposition Final User 01/17/2022 5:32:01 PM General Information Provided Yes Loretha Stapler Call Closed By: Loretha Stapler Transaction Date/Time: 01/17/2022 5:29:09 PM (ET

## 2022-02-04 ENCOUNTER — Telehealth: Payer: Self-pay | Admitting: Primary Care

## 2022-02-04 DIAGNOSIS — I10 Essential (primary) hypertension: Secondary | ICD-10-CM

## 2022-02-04 DIAGNOSIS — K219 Gastro-esophageal reflux disease without esophagitis: Secondary | ICD-10-CM

## 2022-02-04 MED ORDER — HYDROCHLOROTHIAZIDE 25 MG PO TABS: 25.0000 mg | ORAL_TABLET | Freq: Every day | ORAL | 0 refills | Status: DC

## 2022-02-04 MED ORDER — FAMOTIDINE 20 MG PO TABS: 20.0000 mg | ORAL_TABLET | Freq: Two times a day (BID) | ORAL | 0 refills | Status: DC

## 2022-02-11 ENCOUNTER — Ambulatory Visit: Payer: BC Managed Care – PPO | Admitting: Primary Care

## 2022-03-16 ENCOUNTER — Ambulatory Visit: Payer: BC Managed Care – PPO | Admitting: Primary Care

## 2022-08-31 ENCOUNTER — Other Ambulatory Visit: Payer: Self-pay | Admitting: Primary Care

## 2022-08-31 DIAGNOSIS — K219 Gastro-esophageal reflux disease without esophagitis: Secondary | ICD-10-CM

## 2022-08-31 DIAGNOSIS — I10 Essential (primary) hypertension: Secondary | ICD-10-CM

## 2023-08-02 ENCOUNTER — Encounter: Payer: Self-pay | Admitting: General Practice

## 2023-08-02 ENCOUNTER — Telehealth (INDEPENDENT_AMBULATORY_CARE_PROVIDER_SITE_OTHER): Payer: 59 | Admitting: General Practice

## 2023-08-02 ENCOUNTER — Telehealth: Payer: Self-pay | Admitting: Primary Care

## 2023-08-02 ENCOUNTER — Telehealth: Payer: Self-pay

## 2023-08-02 DIAGNOSIS — U071 COVID-19: Secondary | ICD-10-CM | POA: Insufficient documentation

## 2023-08-02 MED ORDER — PROMETHAZINE-DM 6.25-15 MG/5ML PO SYRP: 5.0000 mL | ORAL_SOLUTION | Freq: Four times a day (QID) | ORAL | 0 refills | Status: AC | PRN

## 2023-08-02 NOTE — Progress Notes (Signed)
Virtual Visit via Video Note  I connected with [name] on 08/03/23 at  3:40 PM EST by a video enabled telemedicine application and verified that I am speaking with the correct person using two identifiers.  Patient Location: Home Provider Location: Office/Clinic  I discussed the limitations, risks, security, and privacy concerns of performing an evaluation and management service by video and the availability of in person appointments. I also discussed with the patient that there may be a patient responsible charge related to this service. The patient expressed understanding and agreed to proceed.  Subjective: PCP: [name], NP  Chief Complaint  Patient presents with   Covid Positive    Symptoms began 12/16 with a slight cough. Yesterday cough worsened and she started having chills, lethargic, fever.    HPI  [name] is a 47 year old female, patient of Vernona Rieger, NP, with past medical history of HTN, GERD, who presents today for a virtual visit to discuss covid + home test.   Her symptom onset was on 07/31/23 with cough and then on 08/01/23 she started getting body aches, feeling lethargic, feeling like she has a fever (she didn't check it), loss of taste and smell. She did a home covid test on 08/01/23 which tested positive. She has tried Theraflu, robitussin, vitamin-c, emergency c, advil sinus and dayquil with minimal relief.  She denies any chest pain, shortness of breath or difficulty breathing. She denies any sinus pain or pressure. Her worst symptoms is her cough. She does not have a pulse oximeter at home to check her oxygen.   Unknown if she received the covid booster this season.   No known history of diabetes, cancer or any other immuno comprised disease.   ROS: Per HPI  Current Outpatient Medications:    amLODipine (NORVASC) 5 MG tablet, TAKE 1 TABLET BY MOUTH EVERY DAY FOR BLOOD PRESSURE, Disp: 90 tablet, Rfl: 0   aspirin-acetaminophen-caffeine  (EXCEDRIN MIGRAINE) 250-250-65 MG tablet, Take 2 tablets every 6 (six) hours as needed by mouth for headache or migraine., Disp: , Rfl:    famotidine (PEPCID) 20 MG tablet, Take 1 tablet (20 mg total) by mouth 2 (two) times daily. For heartburn. Office visit required for further refills., Disp: 180 tablet, Rfl: 0   hydrochlorothiazide (HYDRODIURIL) 25 MG tablet, Take 1 tablet (25 mg total) by mouth daily. For blood pressure. Office visit required for further refills., Disp: 90 tablet, Rfl: 0   ibuprofen (ADVIL) 200 MG tablet, Take 200 mg by mouth every 6 (six) hours as needed., Disp: , Rfl:    promethazine-dextromethorphan (PROMETHAZINE-DM) 6.25-15 MG/5ML syrup, Take 5 mLs by mouth 4 (four) times daily as needed for up to 7 days for cough., Disp: 100 mL, Rfl: 0   hydrALAZINE (APRESOLINE) 25 MG tablet, Take 1 tablet (25 mg total) by mouth 3 (three) times daily., Disp: 90 tablet, Rfl: 0  Observations/Objective: There were no vitals filed for this visit. Physical Exam  General: Alert and oriented x 3, no distress, does appear sickly.   Pulmonary: Speaks in complete sentences without increased work of breathing, did cough during visit.  Psychiatric: Normal mood, thought content, and behavior.   Assessment and Plan: COVID-19 Assessment & Plan: Covid 19 positive test at home.   She does not meet the need to treat with antiviral at this time.   Will treat conservatively with OTC medication and cough medication to help with sleep. She will update if her symptoms worsen. ER/UC precautions discussed  with patient at length.  Start Promethazine-dm 5 ml by mouth 4 times a day as needed. Rx sent.  Patient advised that this medication can make her drowsy.  Discussed the quarantine guidelines with patient.   Orders: -     Promethazine-DM; Take 5 mLs by mouth 4 (four) times daily as needed for up to 7 days for cough.  Dispense: 100 mL; Refill: 0    Follow Up Instructions: Return if symptoms  worsen or fail to improve.   I discussed the assessment and treatment plan with the patient. The patient was provided an opportunity to ask questions, and all were answered. The patient agreed with the plan and demonstrated an understanding of the instructions.   The patient was advised to call back or seek an in-person evaluation if the symptoms worsen or if the condition fails to improve as anticipated.  The above assessment and management plan was discussed with the patient. The patient verbalized understanding of and has agreed to the management plan.   Modesto Charon, NP

## 2023-08-02 NOTE — Assessment & Plan Note (Addendum)
Covid 19 positive test at home.   She does not meet the need to treat with antiviral at this time.   Will treat conservatively with OTC medication and cough medication to help with sleep. She will update if her symptoms worsen. ER/UC precautions discussed with patient at length.  Start Promethazine-dm 5 ml by mouth 4 times a day as needed. Rx sent.  Patient advised that this medication can make her drowsy.  Discussed the quarantine guidelines with patient.

## 2023-08-02 NOTE — Telephone Encounter (Signed)
Received CRM: Patient states that she tested positive for COVID-19 and wanted to know if it was something that can be prescribed to her, is requesting a call back.

## 2023-08-02 NOTE — Telephone Encounter (Signed)
Called and spoke to patient, provided work note for patients visit today .

## 2023-08-02 NOTE — Telephone Encounter (Signed)
Reason for CRM: Patient states she was seen by Modesto Charon today & is requesting a doctors note for her visit.

## 2023-08-02 NOTE — Patient Instructions (Addendum)
Cough medication - promethazine- dm sent to the pharmacy. As discussed this medication can make you drowsy. You can take 5 ml upto four times a day as needed.  You can try a few things over the counter to help with your symptoms including:  Cough: Delsym or Robitussin (get the off brand, works just as well) Chest Congestion: Mucinex (plain) Nasal Congestion/Ear Pressure/Sinus Pressure: Try using Flonase (fluticasone) nasal spray. Instill 1 spray in each nostril twice daily. This can be purchased over the counter. Body aches, fevers, headache: Ibuprofen (not to exceed 2400 mg in 24 hours) or Acetaminophen-Tylenol (not to exceed 3000 mg in 24 hours) Runny Nose/Throat Drainage/Sneezing/Itchy or Watery Eyes: An antihistamine such as Zyrtec, Claritin, Xyzal, Allegra  You should be feeling better by day seven of symptoms, but please do contact me if this is not the case.  CDC recommends quarantine until day 5 from your symptom onset. Then wear a mask until 5 extra days after that.   Please seek emergency room care if you develop shortness of breath, difficulty breathing or chest pain. Continue to monitor your oxygen levels.  Hope you feel better.

## 2023-08-02 NOTE — Telephone Encounter (Signed)
Called and spoke with patient, scheduled virtual appt with Modesto Charon at 3:40pm

## 2023-12-14 ENCOUNTER — Encounter: Admitting: Primary Care

## 2024-01-15 ENCOUNTER — Other Ambulatory Visit: Payer: Self-pay

## 2024-01-15 ENCOUNTER — Encounter (HOSPITAL_COMMUNITY): Payer: Self-pay

## 2024-01-15 ENCOUNTER — Emergency Department (HOSPITAL_COMMUNITY)

## 2024-01-15 ENCOUNTER — Emergency Department (HOSPITAL_COMMUNITY)
Admission: EM | Admit: 2024-01-15 | Discharge: 2024-01-16 | Disposition: A | Discharge status: Home / Self Care | Attending: Emergency Medicine | Admitting: Emergency Medicine

## 2024-01-15 DIAGNOSIS — I10 Essential (primary) hypertension: Secondary | ICD-10-CM | POA: Insufficient documentation

## 2024-01-15 DIAGNOSIS — G935 Compression of brain: Secondary | ICD-10-CM

## 2024-01-15 DIAGNOSIS — G510 Bell's palsy: Secondary | ICD-10-CM

## 2024-01-15 DIAGNOSIS — Z79899 Other long term (current) drug therapy: Secondary | ICD-10-CM | POA: Diagnosis not present

## 2024-01-15 DIAGNOSIS — R531 Weakness: Secondary | ICD-10-CM

## 2024-01-15 DIAGNOSIS — Z7982 Long term (current) use of aspirin: Secondary | ICD-10-CM | POA: Diagnosis not present

## 2024-01-15 DIAGNOSIS — R29810 Facial weakness: Secondary | ICD-10-CM | POA: Diagnosis present

## 2024-01-15 LAB — COMPREHENSIVE METABOLIC PANEL WITH GFR
ALT: 14 U/L (ref 0–44)
AST: 29 U/L (ref 15–41)
Albumin: 3.3 g/dL — ABNORMAL LOW (ref 3.5–5.0)
Alkaline Phosphatase: 23 U/L — ABNORMAL LOW (ref 38–126)
Anion gap: 9 (ref 5–15)
BUN: 17 mg/dL (ref 6–20)
CO2: 24 mmol/L (ref 22–32)
Calcium: 9.1 mg/dL (ref 8.9–10.3)
Chloride: 106 mmol/L (ref 98–111)
Creatinine, Ser: 1.4 mg/dL — ABNORMAL HIGH (ref 0.44–1.00)
GFR, Estimated: 47 mL/min — ABNORMAL LOW (ref >60)
Glucose, Bld: 86 mg/dL (ref 70–99)
Potassium: 3.9 mmol/L (ref 3.5–5.1)
Sodium: 139 mmol/L (ref 135–145)
Total Bilirubin: 0.6 mg/dL (ref 0.0–1.2)
Total Protein: 9 g/dL — ABNORMAL HIGH (ref 6.5–8.1)

## 2024-01-15 LAB — ETHANOL: Alcohol, Ethyl (B): <15 mg/dL (ref <15)

## 2024-01-15 LAB — I-STAT CHEM 8, ED
BUN: 18 mg/dL (ref 6–20)
Calcium, Ion: 1.15 mmol/L (ref 1.15–1.40)
Chloride: 106 mmol/L (ref 98–111)
Creatinine, Ser: 1.5 mg/dL — ABNORMAL HIGH (ref 0.44–1.00)
Glucose, Bld: 85 mg/dL (ref 70–99)
HCT: 33 % — ABNORMAL LOW (ref 36.0–46.0)
Hemoglobin: 11.2 g/dL — ABNORMAL LOW (ref 12.0–15.0)
Potassium: 3.9 mmol/L (ref 3.5–5.1)
Sodium: 143 mmol/L (ref 135–145)
TCO2: 26 mmol/L (ref 22–32)

## 2024-01-15 LAB — DIFFERENTIAL
Abs Immature Granulocytes: 0.01 10*3/uL (ref 0.00–0.07)
Basophils Absolute: 0 10*3/uL (ref 0.0–0.1)
Basophils Relative: 1 %
Eosinophils Absolute: 0.2 10*3/uL (ref 0.0–0.5)
Eosinophils Relative: 3 %
Immature Granulocytes: 0 %
Lymphocytes Relative: 35 %
Lymphs Abs: 1.7 10*3/uL (ref 0.7–4.0)
Monocytes Absolute: 0.6 10*3/uL (ref 0.1–1.0)
Monocytes Relative: 12 %
Neutro Abs: 2.4 10*3/uL (ref 1.7–7.7)
Neutrophils Relative %: 49 %

## 2024-01-15 LAB — CBC
HCT: 33.6 % — ABNORMAL LOW (ref 36.0–46.0)
Hemoglobin: 11.1 g/dL — ABNORMAL LOW (ref 12.0–15.0)
MCH: 31.7 pg (ref 26.0–34.0)
MCHC: 33 g/dL (ref 30.0–36.0)
MCV: 96 fL (ref 80.0–100.0)
Platelets: 229 10*3/uL (ref 150–400)
RBC: 3.5 MIL/uL — ABNORMAL LOW (ref 3.87–5.11)
RDW: 13.1 % (ref 11.5–15.5)
WBC: 4.9 10*3/uL (ref 4.0–10.5)
nRBC: 0 % (ref 0.0–0.2)

## 2024-01-15 LAB — APTT: aPTT: 30 s (ref 24–36)

## 2024-01-15 LAB — PROTIME-INR
INR: 1.1 (ref 0.8–1.2)
Prothrombin Time: 14.7 s (ref 11.4–15.2)

## 2024-01-15 MED ORDER — HYDROCHLOROTHIAZIDE 25 MG PO TABS
25.0000 mg | ORAL_TABLET | Freq: Once | ORAL | Status: AC
  Administered 2024-01-15: 25 mg via ORAL
  Filled 2024-01-15: qty 1

## 2024-01-15 MED ORDER — ONDANSETRON HCL 4 MG/2ML IJ SOLN
4.0000 mg | Freq: Once | INTRAMUSCULAR | Status: AC
  Administered 2024-01-15: 4 mg via INTRAVENOUS
  Filled 2024-01-15: qty 2

## 2024-01-15 MED ORDER — KETOROLAC TROMETHAMINE 30 MG/ML IJ SOLN
30.0000 mg | Freq: Once | INTRAMUSCULAR | Status: AC
  Administered 2024-01-15: 30 mg via INTRAVENOUS
  Filled 2024-01-15: qty 1

## 2024-01-15 MED ORDER — SODIUM CHLORIDE 0.9% FLUSH: 3.0000 mL | Freq: Once | INTRAVENOUS | Status: DC

## 2024-01-15 MED ORDER — MORPHINE SULFATE (PF) 4 MG/ML IV SOLN
4.0000 mg | Freq: Once | INTRAVENOUS | Status: AC
  Administered 2024-01-15: 4 mg via INTRAVENOUS
  Filled 2024-01-15: qty 1

## 2024-01-15 MED ORDER — HYDRALAZINE HCL 20 MG/ML IJ SOLN
20.0000 mg | Freq: Once | INTRAMUSCULAR | Status: AC
  Administered 2024-01-15: 20 mg via INTRAVENOUS
  Filled 2024-01-15: qty 1

## 2024-01-15 MED ORDER — AMLODIPINE BESYLATE 5 MG PO TABS
5.0000 mg | ORAL_TABLET | Freq: Once | ORAL | Status: AC
  Administered 2024-01-15: 5 mg via ORAL
  Filled 2024-01-15: qty 1

## 2024-01-15 MED ORDER — METOCLOPRAMIDE HCL 5 MG/ML IJ SOLN
5.0000 mg | Freq: Once | INTRAMUSCULAR | Status: AC
  Administered 2024-01-15: 5 mg via INTRAVENOUS
  Filled 2024-01-15: qty 2

## 2024-01-15 MED ORDER — DIPHENHYDRAMINE HCL 50 MG/ML IJ SOLN
12.5000 mg | Freq: Once | INTRAMUSCULAR | Status: AC
  Administered 2024-01-15: 12.5 mg via INTRAVENOUS
  Filled 2024-01-15: qty 1

## 2024-01-15 MED ORDER — SODIUM CHLORIDE 0.9 % IV BOLUS
500.0000 mL | Freq: Once | INTRAVENOUS | Status: AC
  Administered 2024-01-15: 500 mL via INTRAVENOUS

## 2024-01-15 NOTE — ED Notes (Signed)
 Pt returned, no acute distress. Denies any headache, dizziness, or changes in the way she feels -per pt. Alert and denies any pain at this time.

## 2024-01-15 NOTE — ED Notes (Addendum)
 Pt had episode of vomiting small amount of yellow fluid. MD notified. Pt notes headache 4/10. Denies any chest pain, dizziness, or shob.

## 2024-01-15 NOTE — ED Notes (Signed)
 Pt taken to MRI

## 2024-01-15 NOTE — ED Triage Notes (Signed)
 Pt came in via POV d/t Rt side of mouth is numb & her Lt eye will not close, she noticed this since she woke up since approx 0500. Pt states that her eye sight is blurry as well, BP is 227/151 in triage. A/Ox4, denies pain.

## 2024-01-15 NOTE — ED Notes (Signed)
 Pt reports 2 more episodes of vomiting yellow content. Denies any chest pain or dizziness. Reports headache has worsened 8/10. MD notified.

## 2024-01-15 NOTE — ED Notes (Signed)
 Pt taken to CT scan.

## 2024-01-15 NOTE — ED Notes (Signed)
 Pt states that she went to the restroom and upon returning notes SHOB, headache 5/10, "feeling tired" and palpitations. HR is at 105 at rest, O2 100% and RR 16. Denies any chest pain or dizziness. Provider notified.

## 2024-01-15 NOTE — ED Provider Notes (Signed)
 [place] [facility] Provider Note   CSN: [id number] Arrival date & time: 01/15/24  1311     History  Chief Complaint  Patient presents with   Facial Droop    [name] is a 48 y.o. female.  HPI 48 yo female presents complaining of right facial weakness and left eye difficulty closing. No extremity weakness.  She has a history of hypertension but has not been taking her meds for a year as she is trying to figure out what the root cause is.  She denies headache.  Symptoms present since awakening today with lkn at 1130 loc.     Home Medications Prior to Admission medications   Medication Sig Start Date End Date Taking? Authorizing Provider  amLODipine  (NORVASC ) 5 MG tablet TAKE 1 TABLET BY MOUTH EVERY DAY FOR BLOOD PRESSURE 08/09/21   Clark, Katherine K, NP  aspirin-acetaminophen -caffeine (EXCEDRIN MIGRAINE) 250-250-65 MG tablet Take 2 tablets every 6 (six) hours as needed by mouth for headache or migraine.    [provider]  famotidine  (PEPCID ) 20 MG tablet Take 1 tablet (20 mg total) by mouth 2 (two) times daily. For heartburn. Office visit required for further refills. 02/04/22   Clark, Katherine K, NP  hydrALAZINE  (APRESOLINE ) 25 MG tablet Take 1 tablet (25 mg total) by mouth 3 (three) times daily. 11/10/21 12/10/21  Eloise Hake Scales, PA-C  hydrochlorothiazide  (HYDRODIURIL ) 25 MG tablet Take 1 tablet (25 mg total) by mouth daily. For blood pressure. Office visit required for further refills. 02/04/22   Clark, Katherine K, NP  ibuprofen (ADVIL) 200 MG tablet Take 200 mg by mouth every 6 (six) hours as needed.    [provider]      Allergies    Patient has no known allergies.    Review of Systems   Review of Systems  Physical Exam Updated Vital Signs BP (!) 219/120   Pulse (!) 104   Temp 98.9 F (37.2 C)   Resp (!) 22   Ht 1.803 m (5\' 11" )   Wt 99.8 kg   SpO2 100%   BMI 30.68 kg/m  Physical Exam Vitals  and nursing note reviewed.  Constitutional:      General: She is not in acute distress.    Appearance: Normal appearance. She is obese.  HENT:     Head: Normocephalic and atraumatic.     Right Ear: External ear normal.     Left Ear: External ear normal.     Nose: Nose normal.     Mouth/Throat:     Pharynx: Oropharynx is clear.  Eyes:     Extraocular Movements: Extraocular movements intact.     Conjunctiva/sclera: Conjunctivae normal.     Pupils: Pupils are equal, round, and reactive to light.  Cardiovascular:     Rate and Rhythm: Normal rate and regular rhythm.     Pulses: Normal pulses.  Pulmonary:     Effort: Pulmonary effort is normal.  Abdominal:     General: Abdomen is flat.     Palpations: Abdomen is soft.  Musculoskeletal:        General: Normal range of motion.     Cervical back: Normal range of motion and neck supple.  Skin:    General: Skin is warm and dry.     Capillary Refill: Capillary refill takes less than 2 seconds.  Neurological:     Mental Status: She is alert and oriented to person, place, and time.  Cranial Nerves: Cranial nerve deficit present.     Motor: Weakness present.     Coordination: Coordination normal.     Deep Tendon Reflexes: Reflexes normal.     Comments: Right mouth droop, difficulty with keeping left eye closed  Psychiatric:        Mood and Affect: Mood normal.        Behavior: Behavior normal.     ED Results / Procedures / Treatments   Labs (all labs ordered are listed, but only abnormal results are displayed) Labs Reviewed  CBC - Abnormal; Notable for the following components:      Result Value   RBC 3.50 (*)    Hemoglobin 11.1 (*)    HCT 33.6 (*)    All other components within normal limits  COMPREHENSIVE METABOLIC PANEL WITH GFR - Abnormal; Notable for the following components:   Creatinine, Ser 1.40 (*)    Total Protein 9.0 (*)    Albumin 3.3 (*)    Alkaline Phosphatase 23 (*)    GFR, Estimated 47 (*)    All other  components within normal limits  I-STAT CHEM 8, ED - Abnormal; Notable for the following components:   Creatinine, Ser 1.50 (*)    Hemoglobin 11.2 (*)    HCT 33.0 (*)    All other components within normal limits  PROTIME-INR  APTT  DIFFERENTIAL  ETHANOL    EKG EKG Interpretation Date/Time:  Monday January 15 2024 14:32:53 EDT Ventricular Rate:  90 PR Interval:  200 QRS Duration:  101 QT Interval:  413 QTC Calculation: 506 R Axis:   9  Text Interpretation: Sinus rhythm Minimal ST elevation, anterior leads Borderline prolonged QT interval similar to previous Confirmed by Florentino Hurdle [phone]) on 01/15/2024 3:34:48 PM  Radiology No results found.  Procedures .Critical Care  Performed by: Auston Blush, MD Authorized by: Auston Blush, MD   Critical care provider statement:    Critical care time (minutes):  60   Critical care end time:  01/15/2024 3:52 PM   Critical care time was exclusive of:  Separately billable procedures and treating other patients and teaching time   Critical care was necessary to treat or prevent imminent or life-threatening deterioration of the following conditions:  CNS failure or compromise   Critical care was time spent personally by me on the following activities:  Development of treatment plan with patient or surrogate, discussions with consultants, evaluation of patient's response to treatment, examination of patient, ordering and review of laboratory studies, ordering and review of radiographic studies, ordering and performing treatments and interventions, pulse oximetry, re-evaluation of patient's condition and review of old charts     Medications Ordered in ED Medications  sodium chloride  flush (NS) 0.9 % injection 3 mL (0 mLs Intravenous Hold 01/15/24 1338)  amLODipine  (NORVASC ) tablet 5 mg (5 mg Oral Given 01/15/24 1433)  hydrochlorothiazide  (HYDRODIURIL ) tablet 25 mg (25 mg Oral Given 01/15/24 1433)  hydrALAZINE  (APRESOLINE ) injection 20 mg (20 mg  Intravenous Given 01/15/24 1516)    ED Course/ Medical Decision Making/ A&P                                 Medical Decision Making Risk Prescription drug management.   48 year old female history of hypertension presents today complaining of right facial mouth droop and difficulty keeping her left eye closed. Differential diagnosis includes but is not limited to hypertensive urgency, stroke, peripheral neuropathy, MS, intracranial  mass lesions or bleeding. Patient with hypertension with medication noncompliance.  He has received p.o. Norvasc  and hydrochlorothiazide  and IV hydralazine .  Patient's blood pressure appears to be trending down.  This will need to be reevaluated and patient may be discharged on her previous oral medications if blood pressure is adequately controlled 2 patient with right facial droop and left eye symptoms.  Will need MRI after CT obtained.  Facial symptoms on right appear consistent with Bell's palsy, however left eye is not consistent and must consider other etiologies such as hypertensive urgency, stroke with last known normal last night, MS.  Care signed out to Dr. Netty Barba who will dispo after above work up complete        Final Clinical Impression(s) / ED Diagnoses Final diagnoses:  Hypertension, unspecified type  Weakness    Rx / DC Orders ED Discharge Orders     None         Auston Blush, MD 01/15/24 1552

## 2024-01-15 NOTE — ED Provider Notes (Signed)
 Patient signed out to me by previous provider. Please refer to their note for full HPI.  Briefly this is a 48 year old female who presented complaining of right-sided facial weakness and difficulty closing the left eye.  History of hypertension but noncompliant with medications.  Noted to be hypertensive.  Blood pressure medications given along with an IV dose.  Patient signed out pending head CT.  Head CT is reassuring with some incidental findings.  However given the ongoing neuro complaints in the setting of HTN an MRI of the brain will be pursued.  Blood pressure is improving.  Patient now complaining of headache with nausea.  Migraine cocktail given.  Slight improvement with nausea.  MRI images are still pending.  Patient continues to feel nauseous, possibly secondary to medication cocktail.  Further nausea medicine ordered.  MRI shows no acute stroke.  There is findings of old microhemorrhage.  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  Consulted with on-call neurologist, Dr. Murvin Arthurs.  Concern would be that the patient's headache, nausea/vomiting and facial findings could be related to symptomatic Chiari malformation.  He recommends consultation with neurosurgery for further guidance.  Patient signed out pending neurosurgery consultation.   Flonnie Humphrey, DO 01/15/24 2353

## 2024-01-15 NOTE — ED Provider Triage Note (Signed)
 Emergency Medicine Provider Triage Evaluation Note  [name] , a 48 y.o. female  was evaluated in triage.  Pt complains of new onset right sided mouth numbness without right sided facial numbness, and left eye inability to close.  Symptoms since she woke up around 5 AM, normal last night.  Last known well was the last night when she went to bed.  BP 227/151 in triage.  Has been previously diagnosed with hypertension but reports that she does not want to take medication for it and so has not been taking anything.  Review of Systems  Positive: lip numbness, difficulty blinking Negative:   Physical Exam  BP (!) 227/151   Pulse 95   Temp 98.9 F (37.2 C)   Resp 16   Ht 5\' 11"  (1.803 m)   Wt 99.8 kg   SpO2 97%   BMI 30.68 kg/m  Gen:   Awake, no distress   Resp:  Normal effort  MSK:   Moves extremities without difficulty  Other:  Endorses different in sensation with sensation reduced of right lip, no numbness of any other area of facial nerve -- able to squinch her eyes shut with effort but does not have blink of left eye on gentle blinking. Smile also somewhat asymmetric  Medical Decision Making  Medically screening exam initiated at 1:33 PM.  Appropriate orders placed.  [name] was informed that the remainder of the evaluation will be completed by another provider, this initial triage assessment does not replace that evaluation, and the importance of remaining in the ED until their evaluation is complete.  Workup initiated in triage    Nelly Banco, [place] 01/15/24 1338

## 2024-01-16 ENCOUNTER — Telehealth (HOSPITAL_COMMUNITY): Payer: Self-pay | Admitting: *Deleted

## 2024-01-16 MED ORDER — AMLODIPINE BESYLATE 5 MG PO TABS: 5.0000 mg | ORAL_TABLET | Freq: Every day | ORAL | 0 refills | Status: DC

## 2024-01-16 MED ORDER — HYDROCHLOROTHIAZIDE 25 MG PO TABS: 25.0000 mg | ORAL_TABLET | Freq: Every day | ORAL | 0 refills | Status: DC

## 2024-01-16 MED ORDER — PREDNISONE 20 MG PO TABS: 40.0000 mg | ORAL_TABLET | Freq: Every day | ORAL | 0 refills | Status: AC

## 2024-01-16 MED ORDER — VALACYCLOVIR HCL 1 G PO TABS: 1000.0000 mg | ORAL_TABLET | Freq: Three times a day (TID) | ORAL | 0 refills | Status: DC

## 2024-01-16 NOTE — Telephone Encounter (Signed)
Pt called requesting work note.

## 2024-01-16 NOTE — ED Provider Notes (Addendum)
 Patient signed out pending neurosurgery consultation.  Overall improved from initial admission.  Neurosurgery is feels patient can follow-up as an outpatient.  This was discussed with the patient and her family.  Will discharge with neurosurgery follow-up.  Repeat blood pressure 169/109 which is greatly improved.  Patient was supposed to be on blood pressure medications but has not been taking at home.  Will restart amlodipine  and HCTZ.  Will also treat for presumed Bell's palsy with prednisone  and Valtrex.  Physical Exam  BP (!) 158/99   Pulse 96   Temp 98 F (36.7 C) (Oral)   Resp (!) 25   Ht 1.803 m (5\' 11" )   Wt 99.8 kg   SpO2 98%   BMI 30.68 kg/m    Procedures  Procedures  ED Course / MDM   Clinical Course as of 01/16/24 0023  Tue Jan 16, 2024  0015 Spoke to Dr. Gwendlyn Lemmings.  Recommends follow-up as an outpatient. [CH]    Clinical Course User Index [CH] Gaspard Isbell, Vonzella Guernsey, MD   Medical Decision Making Amount and/or Complexity of Data Reviewed Radiology: ordered.  Risk Prescription drug management.   Problem List Items Addressed This Visit   None Visit Diagnoses       Hypertension, unspecified type    -  Primary   Relevant Medications   amLODipine  (NORVASC ) tablet 5 mg (Completed)   hydrochlorothiazide  (HYDRODIURIL ) tablet 25 mg (Completed)   hydrALAZINE  (APRESOLINE ) injection 20 mg (Completed)   amLODipine  (NORVASC ) 5 MG tablet   hydrochlorothiazide  (HYDRODIURIL ) 25 MG tablet     Weakness         Chiari I malformation (HCC)         Bell's palsy                 Airica Schwartzkopf, Vonzella Guernsey, MD 01/16/24 1191    Rory Collard, MD 01/16/24 [phone]

## 2024-01-16 NOTE — Discharge Instructions (Addendum)
 You were seen today for facial droop and weakness as well as headache.  Most of your symptoms have improved.  Your MRI did show a Chiari malformation which can sometimes cause headache and vomiting.  Follow-up with neurosurgery as an outpatient.  Please restart your blood pressure medications.  You will also be given medications for presumed Bell's palsy.  Take prednisone  and Valtrex as prescribed.  You may find that you have difficulty closing your eye.  You need to make sure to keep the eye moist with eyedrops such as refresh eyedrops that can be obtained at the local pharmacy.

## 2024-01-19 ENCOUNTER — Encounter: Payer: Self-pay | Admitting: Primary Care

## 2024-01-19 ENCOUNTER — Ambulatory Visit: Payer: Self-pay | Admitting: Primary Care

## 2024-01-19 ENCOUNTER — Other Ambulatory Visit: Payer: Self-pay | Admitting: Primary Care

## 2024-01-19 ENCOUNTER — Telehealth: Payer: Self-pay

## 2024-01-19 ENCOUNTER — Ambulatory Visit (INDEPENDENT_AMBULATORY_CARE_PROVIDER_SITE_OTHER): Admitting: Primary Care

## 2024-01-19 VITALS — BP 132/84 | HR 83 | Temp 97.8°F | Ht 71.0 in | Wt 255.0 lb

## 2024-01-19 DIAGNOSIS — Z1231 Encounter for screening mammogram for malignant neoplasm of breast: Secondary | ICD-10-CM

## 2024-01-19 DIAGNOSIS — K219 Gastro-esophageal reflux disease without esophagitis: Secondary | ICD-10-CM | POA: Diagnosis not present

## 2024-01-19 DIAGNOSIS — G510 Bell's palsy: Secondary | ICD-10-CM | POA: Diagnosis not present

## 2024-01-19 DIAGNOSIS — I10 Essential (primary) hypertension: Secondary | ICD-10-CM | POA: Diagnosis not present

## 2024-01-19 DIAGNOSIS — Z0001 Encounter for general adult medical examination with abnormal findings: Secondary | ICD-10-CM | POA: Diagnosis not present

## 2024-01-19 DIAGNOSIS — G43009 Migraine without aura, not intractable, without status migrainosus: Secondary | ICD-10-CM

## 2024-01-19 DIAGNOSIS — R7303 Prediabetes: Secondary | ICD-10-CM | POA: Diagnosis not present

## 2024-01-19 DIAGNOSIS — F411 Generalized anxiety disorder: Secondary | ICD-10-CM

## 2024-01-19 DIAGNOSIS — Z1211 Encounter for screening for malignant neoplasm of colon: Secondary | ICD-10-CM

## 2024-01-19 LAB — LIPID PANEL
Cholesterol: 238 mg/dL — ABNORMAL HIGH (ref 0–200)
HDL: 45.7 mg/dL (ref >39.00)
LDL Cholesterol: 150 mg/dL — ABNORMAL HIGH (ref 0–99)
NonHDL: 192.09
Total CHOL/HDL Ratio: 5
Triglycerides: 209 mg/dL — ABNORMAL HIGH (ref 0.0–149.0)
VLDL: 41.8 mg/dL — ABNORMAL HIGH (ref 0.0–40.0)

## 2024-01-19 LAB — HEMOGLOBIN A1C: Hgb A1c MFr Bld: 5.9 % (ref 4.6–6.5)

## 2024-01-19 MED ORDER — ACYCLOVIR 400 MG PO TABS: 400.0000 mg | ORAL_TABLET | Freq: Every day | ORAL | 0 refills | Status: AC

## 2024-01-19 MED ORDER — FAMOTIDINE 20 MG PO TABS: 20.0000 mg | ORAL_TABLET | Freq: Two times a day (BID) | ORAL | 3 refills | Status: AC

## 2024-01-19 MED ORDER — PROPRANOLOL HCL ER 80 MG PO CP24
80.0000 mg | ORAL_CAPSULE | Freq: Every day | ORAL | 0 refills | Status: DC
Start: 2024-01-19 — End: 2024-04-17

## 2024-01-19 MED ORDER — SUMATRIPTAN SUCCINATE 50 MG PO TABS: ORAL_TABLET | ORAL | 0 refills | Status: DC

## 2024-01-19 NOTE — Assessment & Plan Note (Signed)
 Uncontrolled.  Resume famotidine  20 mg BID. Refills provided.

## 2024-01-19 NOTE — Telephone Encounter (Signed)
 We did not discuss any symptoms of difficulty sleeping during her visit. We did cover a lot of other concerns for which I sent prescriptions to the pharmacy.  The propranolol that I prescribed for headache prevention can also help with anxiety and is taken at bedtime.  This may help with sleep.  We can discuss her sleeping in 2 weeks when she returns.

## 2024-01-19 NOTE — Telephone Encounter (Signed)
 Called patient and reviewed all information. Patient verbalized understanding. Will call if any further questions.

## 2024-01-19 NOTE — Assessment & Plan Note (Signed)
Stable per patient. Continue to monitor.

## 2024-01-19 NOTE — Assessment & Plan Note (Signed)
 Repeat A1C pending.

## 2024-01-19 NOTE — Assessment & Plan Note (Signed)
 Uncontrolled.  Discussed options for treatment. Recommended to discontinue Excedrin Migraine given ongoing migraines and renal function.  Start propranolol ER 80 mg HS for migraine prevention. Start Imitrex 50 mg PRN for migraine abortion.   She will update.

## 2024-01-19 NOTE — Telephone Encounter (Signed)
 Copied from CRM [id number]. Topic: General - Other >> Jan 19, 2024 12:18 PM [name] wrote: Reason for CRM: patient is calling in to see If dr Fulton Job is going to call in some medication for sleeping she would like a call back regarding this

## 2024-01-19 NOTE — Patient Instructions (Addendum)
 Stop by the lab prior to leaving today. I will notify you of your results once received.   [facility].   Start propranolol ER 80 mg at bedtime for headache prevention. You may take sumatriptan 50 mg tablets as needed for migraines.  Take 1 tablet by mouth at migraine onset.  May repeat with second tablet 2 hours later if needed.  Start acyclovir 400 mg 5 times daily x 10 days for Bell's palsy.  You will receive a phone call regarding the colonoscopy.  Start famotidine  20 mg twice daily for heartburn.  Please schedule a follow up visit to meet back with me in 2-3 weeks for blood pressure check.   It was a pleasure to see you today!

## 2024-01-19 NOTE — Assessment & Plan Note (Signed)
  Pap smear UTD. Follows with GYN Mammogram due, orders placed. Colonoscopy due, referral placed to GI  Discussed the importance of a healthy diet and regular exercise in order for weight loss, and to reduce the risk of further co-morbidity.  Exam stable. Labs pending.  Follow up in 1 year for repeat physical.

## 2024-01-19 NOTE — Assessment & Plan Note (Signed)
 Improving.  Continue amlodipine  5 mg daily and hydrochlorothiazide  25 mg daily.  BMP reviewed from June 2025. Will repeat in the future.

## 2024-01-19 NOTE — Progress Notes (Signed)
 Subjective:    Patient ID: [name], female    DOB: 27-Mar-1976, 48 y.o.   MRN: [id number]  HPI  [name] is a very pleasant 48 y.o. female who presents today for complete physical, hospital follow-up, and follow up of chronic conditions.  She presented to [facility] ED on 01/15/2024 for right sided facial weakness and difficulty closing left eye.  She admitted to noncompliance to her antihypertensive medications.  During her stay in the ED she underwent CT head which was negative for acute stroke but showed incidental finding of low-lying cerebellar tonsils.  She underwent MRI brain which suggested borderline Chiari I malformation, prior microhemorrhage in the right occipital lobe and possible small underlying cavernous malformation.  No acute stroke.  Labs showed creatinine of 1.5, hemoglobin of 11.2. she is taking 800 mg of Ibuprofen three times weekly for joint pain. She was referred to neurosurgery in the outpatient setting for follow-up.  Her amlodipine  and hydrochlorothiazide  prescriptions were refilled. She was discharged home with prednisone  and Valtrex for Bells Palsy.   She has an appointment with neurosurgery for later this month. She is complaint to her amlodipine  5 mg daily and hydrochlorothiazide  25 mg daily. She continued to experience right sided mouth dropping and left sided eye lid weakness.   Chronic migraines since the age of 24. She continues to experience migraines once weekly which are located to the left frontal lobe. She experiences photophobia, phonophobia, and nausea. Migraines will last  several hours. She will take Excedrin Migriane with improvement.   She continues to experience recurrent esophageal burning, belching on a daily basis. She takes baking soda and water with relief. She was once managed on famotidine  20 mg daily which helped.    Immunizations: -Tetanus: Completed in  Diet: Fair diet.  Exercise: No regular exercise.  Eye exam: Completed years  ago Dental exam: Completed years ago  Pap Smear: Completed per GYN Mammogram: Completed in 2022  Colonoscopy: Completed several years ago   BP Readings from Last 3 Encounters:  01/19/24 132/84  01/16/24 (!) 158/99  12/14/21 134/82      Review of Systems  Constitutional:  Negative for unexpected weight change.  HENT:  Negative for rhinorrhea.   Respiratory:  Negative for cough and shortness of breath.   Cardiovascular:  Negative for chest pain.  Gastrointestinal:  Negative for constipation and diarrhea.  Genitourinary:  Negative for difficulty urinating and hematuria.  Musculoskeletal:  Positive for arthralgias.  Skin:  Negative for rash.  Allergic/Immunologic: Negative for environmental allergies.  Neurological:  Positive for headaches. Negative for dizziness and numbness.  Psychiatric/Behavioral:  The patient is nervous/anxious.          Past Medical History:  Diagnosis Date   Diverticulitis    November 2018   Hypertension     Social History   Socioeconomic History   Marital status: Single    Spouse name: Not on file   Number of children: Not on file   Years of education: Not on file   Highest education level: Master's degree (e.g., MA, MS, MEng, MEd, MSW, MBA)  Occupational History   Not on file  Tobacco Use   Smoking status: Never   Smokeless tobacco: Never  Substance and Sexual Activity   Alcohol use: No   Drug use: No   Sexual activity: Not Currently    Birth control/protection: Surgical  Other Topics Concern   Not on file  Social History Narrative   Single but soon to be  engaged.   Twin girls.   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, environmental   Enjoys relaxing and Tree surgeon   Social Drivers of Corporate investment banker Strain: Low Risk  (01/18/2024)   Overall Financial Resource Strain (CARDIA)    Difficulty of Paying Living Expenses: Not hard at all  Food Insecurity: No Food Insecurity (01/18/2024)   Hunger Vital Sign    Worried About Running Out of Food  in the Last Year: Never true    Ran Out of Food in the Last Year: Never true  Transportation Needs: No Transportation Needs (01/18/2024)   PRAPARE - Administrator, Civil Service (Medical): No    Lack of Transportation (Non-Medical): No  Physical Activity: Unknown (01/18/2024)   Exercise Vital Sign    Days of Exercise per Week: 0 days    Minutes of Exercise per Session: Not on file  Stress: Stress Concern Present (01/18/2024)   [employer] of Occupational Health - Occupational Stress Questionnaire    Feeling of Stress : Very much  Social Connections: Moderately Isolated (01/18/2024)   Social Connection and Isolation Panel [NHANES]    Frequency of Communication with Friends and Family: Once a week    Frequency of Social Gatherings with Friends and Family: Once a week    Attends Religious Services: More than 4 times per year    Active Member of [employer] or Organizations: Yes    Attends Engineer, structural: More than 4 times per year    Marital Status: Never married  Intimate Partner Violence: Not on file    Past Surgical History:  Procedure Laterality Date   ABDOMINAL HYSTERECTOMY     Partial    APPENDECTOMY     CHOLECYSTECTOMY     SMALL INTESTINE SURGERY     November 2018    Family History  Problem Relation Age of Onset   Diverticulitis Mother    Hypertension Mother    Diabetes Father    CAD Father    Hypertension Father    Eczema Father    Anxiety disorder Father    Diverticulitis Sister    Hypertension Sister    Breast cancer Maternal Aunt     No Known Allergies  Current Outpatient Medications on File Prior to Visit  Medication Sig Dispense Refill   amLODipine  (NORVASC ) 5 MG tablet Take 1 tablet (5 mg total) by mouth daily. 30 tablet 0   aspirin-acetaminophen -caffeine (EXCEDRIN MIGRAINE) 250-250-65 MG tablet Take 2 tablets every 6 (six) hours as needed by mouth for headache or migraine.     hydrochlorothiazide  (HYDRODIURIL ) 25 MG tablet Take 1  tablet (25 mg total) by mouth daily. 30 tablet 0   predniSONE  (DELTASONE ) 20 MG tablet Take 2 tablets (40 mg total) by mouth daily. 10 tablet 0   No current facility-administered medications on file prior to visit.    BP 132/84   Pulse 83   Temp 97.8 F (36.6 C) (Temporal)   Ht 5\' 11"  (1.803 m)   Wt 255 lb (115.7 kg)   SpO2 99%   BMI 35.57 kg/m  Objective:   Physical Exam HENT:     Right Ear: Tympanic membrane and ear canal normal.     Left Ear: Tympanic membrane and ear canal normal.  Eyes:     Pupils: Pupils are equal, round, and reactive to light.  Cardiovascular:     Rate and Rhythm: Normal rate and regular rhythm.  Pulmonary:     Effort: Pulmonary effort is normal.  Breath sounds: Normal breath sounds.  Abdominal:     General: Bowel sounds are normal.     Palpations: Abdomen is soft.     Tenderness: There is no abdominal tenderness.  Musculoskeletal:        General: Normal range of motion.     Cervical back: Neck supple.  Skin:    General: Skin is warm and dry.  Neurological:     Mental Status: She is alert and oriented to person, place, and time.     Cranial Nerves: No cranial nerve deficit.     Deep Tendon Reflexes:     Reflex Scores:      Patellar reflexes are 2+ on the right side and 2+ on the left side. Psychiatric:        Mood and Affect: Mood normal.           Assessment & Plan:  Encounter for annual general medical examination with abnormal findings in adult Assessment & Plan:  Pap smear UTD. Follows with GYN Mammogram due, orders placed. Colonoscopy due, referral placed to GI  Discussed the importance of a healthy diet and regular exercise in order for weight loss, and to reduce the risk of further co-morbidity.  Exam stable. Labs pending.  Follow up in 1 year for repeat physical.    Essential hypertension Assessment & Plan: Improving.  Continue amlodipine  5 mg daily and hydrochlorothiazide  25 mg daily.  BMP reviewed from June  2025. Will repeat in the future.   Orders: -     Lipid panel  Migraine without aura and without status migrainosus, not intractable Assessment & Plan: Uncontrolled.  Discussed options for treatment. Recommended to discontinue Excedrin Migraine given ongoing migraines and renal function.  Start propranolol ER 80 mg HS for migraine prevention. Start Imitrex 50 mg PRN for migraine abortion.   She will update.  Orders: -     Propranolol HCl ER; Take 1 capsule (80 mg total) by mouth at bedtime. For headache prevention  Dispense: 90 capsule; Refill: 0 -     SUMAtriptan Succinate; Take 1 tablet by mouth at migraine onset,. May repeat in 2 hours if headache persists or recurs.  Dispense: 10 tablet; Refill: 0  Gastroesophageal reflux disease, unspecified whether esophagitis present Assessment & Plan: Uncontrolled.  Resume famotidine  20 mg BID. Refills provided.   Orders: -     Famotidine ; Take 1 tablet (20 mg total) by mouth 2 (two) times daily. for heartburn.  Dispense: 180 tablet; Refill: 3  Prediabetes Assessment & Plan: Repeat A1C pending.  Orders: -     Hemoglobin A1c  GAD (generalized anxiety disorder) Assessment & Plan: Stable per patient.  Continue to monitor.    Bell's palsy Assessment & Plan: With recent ED visit.  ED notes, labs, imaging reviewed.  Side effects with Valtrex.  Start acyclovir 400 mg 5 times daily for 10 days. Continue prednisone  40 mg daily.   She will update.  Orders: -     Acyclovir; Take 1 tablet (400 mg total) by mouth 5 (five) times daily for 10 days.  Dispense: 50 tablet; Refill: 0  Screening mammogram for breast cancer -     3D Screening Mammogram, Left and Right; Future  Screening for colon cancer -     Ambulatory referral to Gastroenterology     40 minutes was spent face-to-face with patient, reviewing chart and medical records from ED visit, developing assessment plan, addressing concerns.  See assessment plan for  notes.   Delma Villalva K Shaleigh Laubscher,  NP

## 2024-01-19 NOTE — Assessment & Plan Note (Signed)
 With recent ED visit.  ED notes, labs, imaging reviewed.  Side effects with Valtrex.  Start acyclovir 400 mg 5 times daily for 10 days. Continue prednisone  40 mg daily.   She will update.

## 2024-02-02 ENCOUNTER — Ambulatory Visit: Admitting: Primary Care

## 2024-02-02 ENCOUNTER — Encounter: Payer: Self-pay | Admitting: Primary Care

## 2024-02-02 ENCOUNTER — Ambulatory Visit: Payer: Self-pay | Admitting: Primary Care

## 2024-02-02 ENCOUNTER — Other Ambulatory Visit: Payer: Self-pay | Admitting: Primary Care

## 2024-02-02 VITALS — BP 122/84 | HR 86 | Temp 97.1°F | Ht 71.0 in | Wt 263.0 lb

## 2024-02-02 DIAGNOSIS — I10 Essential (primary) hypertension: Secondary | ICD-10-CM

## 2024-02-02 DIAGNOSIS — G43009 Migraine without aura, not intractable, without status migrainosus: Secondary | ICD-10-CM | POA: Diagnosis not present

## 2024-02-02 DIAGNOSIS — R0683 Snoring: Secondary | ICD-10-CM | POA: Diagnosis not present

## 2024-02-02 DIAGNOSIS — K219 Gastro-esophageal reflux disease without esophagitis: Secondary | ICD-10-CM

## 2024-02-02 LAB — BASIC METABOLIC PANEL WITH GFR
BUN: 18 mg/dL (ref 6–23)
CO2: 30 meq/L (ref 19–32)
Calcium: 9.2 mg/dL (ref 8.4–10.5)
Chloride: 102 meq/L (ref 96–112)
Creatinine, Ser: 1.36 mg/dL — ABNORMAL HIGH (ref 0.40–1.20)
GFR: 46.83 mL/min — ABNORMAL LOW (ref >60.00)
Glucose, Bld: 98 mg/dL (ref 70–99)
Potassium: 4 meq/L (ref 3.5–5.1)
Sodium: 138 meq/L (ref 135–145)

## 2024-02-02 NOTE — Progress Notes (Signed)
 Subjective:    Patient ID: [name], female    DOB: [birth year], 48 y.o.   MRN: [id number]  HPI  [name] is a very pleasant 48 y.o. female with a history of hypertension, migraines, prediabetes, pedal edema who presents today for follow-up of hypertension and migraines.   1) Hypertension: She was last evaluated on 01/19/2024 for ED follow-up and numerous concerns.  Her blood pressure was uncontrolled due to lack of compliance to medication regimen of amlodipine  and hydrochlorothiazide .  During our visit she resumed her amlodipine  and hydrochlorothiazide  1 day prior so she is here for follow-up today.  Since her last visit she is compliant to amlodipine  5 mg daily, hydrochlorothiazide  25 mg daily. She is not checking her BP at home as her home cuff reads much higher. She's feeling better overall. She has noticed mild ankle edema.   She has a family history of heart disease in her father, no known heart attack.   BP Readings from Last 3 Encounters:  02/02/24 122/84  01/19/24 132/84  01/16/24 (!) 158/99    2) Migraines: Chronic history. Currently managed on propranolol  Er 80 mg daily and Imitrex  50 mg PRN which was initiated a few weeks ago due to uncontrolled symptoms.   Since her last visit she's compliant to propranolol  Er 80 mg daily. She's had 2 headaches since her last visit. She has taken 1 dose of Imitrex  with resolve in a migraine.  3) Sleep Disturbance: Chronic. Difficulty falling and staying asleep. She has difficulty falling asleep due to mind racing thoughts. She goes to bed around 10:30 pm, will lay there until 12 am. She then wakes every 2 hours.   She began taking OTC sleep aids which have helped but not so much anymore. She snores every night and has had witnessed periods of apnea during sleep.  She feels tired during the day, wants to nap during the day.    Review of Systems  Constitutional:  Positive for fatigue.  Respiratory:  Negative for shortness of breath.    Cardiovascular:  Negative for chest pain.  Neurological:  Negative for headaches.  Psychiatric/Behavioral:  Positive for sleep disturbance. The patient is nervous/anxious.          Past Medical History:  Diagnosis Date   Diverticulitis    November 2018   Hypertension     Social History   Socioeconomic History   Marital status: Single    Spouse name: Not on file   Number of children: Not on file   Years of education: Not on file   Highest education level: Master's degree (e.g., MA, MS, MEng, MEd, MSW, MBA)  Occupational History   Not on file  Tobacco Use   Smoking status: Never   Smokeless tobacco: Never  Substance and Sexual Activity   Alcohol use: No   Drug use: No   Sexual activity: Not Currently    Birth control/protection: Surgical  Other Topics Concern   Not on file  Social History Narrative   Single but soon to be engaged.   Twin girls.   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, environmental   Enjoys relaxing and Tree surgeon   Social Drivers of Corporate investment banker Strain: Low Risk  (01/18/2024)   Overall Financial Resource Strain (CARDIA)    Difficulty of Paying Living Expenses: Not hard at all  Food Insecurity: No Food Insecurity (01/18/2024)   Hunger Vital Sign    Worried About Running Out of Food in the Last Year:  Never true    Ran Out of Food in the Last Year: Never true  Transportation Needs: No Transportation Needs (01/18/2024)   PRAPARE - Administrator, Civil Service (Medical): No    Lack of Transportation (Non-Medical): No  Physical Activity: Unknown (01/18/2024)   Exercise Vital Sign    Days of Exercise per Week: 0 days    Minutes of Exercise per Session: Not on file  Stress: Stress Concern Present (01/18/2024)   [employer] of Occupational Health - Occupational Stress Questionnaire    Feeling of Stress : Very much  Social Connections: Moderately Isolated (01/18/2024)   Social Connection and Isolation Panel    Frequency of Communication with  Friends and Family: Once a week    Frequency of Social Gatherings with Friends and Family: Once a week    Attends Religious Services: More than 4 times per year    Active Member of [employer] or Organizations: Yes    Attends Engineer, structural: More than 4 times per year    Marital Status: Never married  Intimate Partner Violence: Not on file    Past Surgical History:  Procedure Laterality Date   ABDOMINAL HYSTERECTOMY     Partial    APPENDECTOMY     CHOLECYSTECTOMY     SMALL INTESTINE SURGERY     November 2018    Family History  Problem Relation Age of Onset   Diverticulitis Mother    Hypertension Mother    Diabetes Father    CAD Father    Hypertension Father    Eczema Father    Anxiety disorder Father    Diverticulitis Sister    Hypertension Sister    Breast cancer Maternal Aunt     No Known Allergies  Current Outpatient Medications on File Prior to Visit  Medication Sig Dispense Refill   amLODipine  (NORVASC ) 5 MG tablet Take 1 tablet (5 mg total) by mouth daily. 30 tablet 0   aspirin-acetaminophen -caffeine (EXCEDRIN MIGRAINE) 250-250-65 MG tablet Take 2 tablets every 6 (six) hours as needed by mouth for headache or migraine.     famotidine  (PEPCID ) 20 MG tablet Take 1 tablet (20 mg total) by mouth 2 (two) times daily. for heartburn. 180 tablet 3   hydrochlorothiazide  (HYDRODIURIL ) 25 MG tablet Take 1 tablet (25 mg total) by mouth daily. 30 tablet 0   propranolol  ER (INDERAL  LA) 80 MG 24 hr capsule Take 1 capsule (80 mg total) by mouth at bedtime. For headache prevention 90 capsule 0   SUMAtriptan  (IMITREX ) 50 MG tablet Take 1 tablet by mouth at migraine onset,. May repeat in 2 hours if headache persists or recurs. 10 tablet 0   predniSONE  (DELTASONE ) 20 MG tablet Take 2 tablets (40 mg total) by mouth daily. (Patient not taking: Reported on 02/02/2024) 10 tablet 0   No current facility-administered medications on file prior to visit.    BP 122/84   Pulse 86    Temp (!) 97.1 F (36.2 C) (Temporal)   Ht 5' 11 (1.803 m)   Wt 263 lb (119.3 kg)   SpO2 98%   BMI 36.68 kg/m  Objective:   Physical Exam  Cardiovascular:     Rate and Rhythm: Normal rate and regular rhythm.  Pulmonary:     Effort: Pulmonary effort is normal.     Breath sounds: Normal breath sounds.   Musculoskeletal:     Cervical back: Neck supple.   Skin:    General: Skin is warm and dry.  Neurological:     Mental Status: She is alert and oriented to person, place, and time.   Psychiatric:        Mood and Affect: Mood normal.           Assessment & Plan:  Essential hypertension Assessment & Plan: Controlled.  Continue amlodipine  5 mg daily and hydrochlorothiazide  25 mg daily. Repeat BMP pending.  Orders: -     Basic metabolic panel with GFR  Snoring Assessment & Plan: Symptoms highly suggestive for sleep apnea. Epworth Sleepiness Scale score of 10 today.  Referral placed to pulmonology for further evaluation and sleep study.  Orders: -     Pulmonary Visit  Migraine without aura and without status migrainosus, not intractable Assessment & Plan: Improved!  Continue propranolol  ER 80 mg daily for headache prevention. Continue sumatriptan  50 mg as needed.   Gastroesophageal reflux disease, unspecified whether esophagitis present Assessment & Plan: Controlled.  Continue famotidine   20 mg twice daily.         Lamon Rotundo K Twylah Bennetts, NP

## 2024-02-02 NOTE — Patient Instructions (Signed)
Stop by the lab prior to leaving today. I will notify you of your results once received.   You will either be contacted via phone regarding your referral to pulmonology, or you may receive a letter on your MyChart portal from our referral team with instructions for scheduling an appointment. Please let us know if you have not been contacted by anyone within two weeks.  It was a pleasure to see you today!

## 2024-02-02 NOTE — Assessment & Plan Note (Signed)
 Controlled.  Continue amlodipine  5 mg daily and hydrochlorothiazide  25 mg daily. Repeat BMP pending.

## 2024-02-02 NOTE — Assessment & Plan Note (Signed)
 Improved!  Continue propranolol  ER 80 mg daily for headache prevention. Continue sumatriptan  50 mg as needed.

## 2024-02-02 NOTE — Assessment & Plan Note (Signed)
Controlled. Continue famotidine 20 mg twice daily.

## 2024-02-02 NOTE — Assessment & Plan Note (Signed)
 Symptoms highly suggestive for sleep apnea. Epworth Sleepiness Scale score of 10 today.  Referral placed to pulmonology for further evaluation and sleep study.

## 2024-02-15 ENCOUNTER — Other Ambulatory Visit: Payer: Self-pay | Admitting: Primary Care

## 2024-02-15 ENCOUNTER — Telehealth: Payer: Self-pay | Admitting: Primary Care

## 2024-02-15 DIAGNOSIS — G43009 Migraine without aura, not intractable, without status migrainosus: Secondary | ICD-10-CM

## 2024-02-15 DIAGNOSIS — I10 Essential (primary) hypertension: Secondary | ICD-10-CM

## 2024-02-15 DIAGNOSIS — K219 Gastro-esophageal reflux disease without esophagitis: Secondary | ICD-10-CM

## 2024-02-15 MED ORDER — AMLODIPINE BESYLATE 5 MG PO TABS: 5.0000 mg | ORAL_TABLET | Freq: Every day | ORAL | 2 refills | Status: AC

## 2024-02-15 MED ORDER — HYDROCHLOROTHIAZIDE 25 MG PO TABS
25.0000 mg | ORAL_TABLET | Freq: Every day | ORAL | 2 refills | Status: AC
Start: 2024-02-15 — End: ?

## 2024-02-15 NOTE — Telephone Encounter (Unsigned)
 Copied from CRM [id number]. Topic: Clinical - Medication Refill >> Feb 15, 2024 11:40 AM [place] J wrote: Medication:  hydrochlorothiazide  (HYDRODIURIL ) 25 MG tablet amLODipine  (NORVASC ) 5 MG tablet\ aspirin-acetaminophen -caffeine (EXCEDRIN MIGRAINE) 250-250-65 MG tablet famotidine  (PEPCID ) 20 MG tablet propranolol  ER (INDERAL  LA) 80 MG 24 hr capsule  Has the patient contacted their pharmacy? Yes (Agent: If no, request that the patient contact the pharmacy for the refill. If patient does not wish to contact the pharmacy document the reason why and proceed with request.) (Agent: If yes, when and what did the pharmacy advise?) Pharmacy tried to submit refill request on behalf of the patient a few days ago but the patient did not hear anything back on our end.  This is the patient's preferred pharmacy:  [facility] [phone], [place] - [address] [place] [address] Phone: [phone] Fax: [phone]  Is this the correct pharmacy for this prescription? Yes If no, delete pharmacy and type the correct one.   Has the prescription been filled recently? No  Is the patient out of the medication? Yes  Has the patient been seen for an appointment in the last year OR does the patient have an upcoming appointment? Yes  Can we respond through MyChart? Yes  Agent: Please be advised that Rx refills may take up to 3 business days. We ask that you follow-up with your pharmacy.

## 2024-02-26 ENCOUNTER — Telehealth: Payer: Self-pay | Admitting: Sleep Medicine

## 2024-02-26 NOTE — Telephone Encounter (Signed)
 LVMTCB to schedule sleep consult.

## 2024-03-04 NOTE — Telephone Encounter (Signed)
 LVMTCB to schedule sleep consult.

## 2024-04-17 ENCOUNTER — Other Ambulatory Visit: Payer: Self-pay | Admitting: Primary Care

## 2024-04-17 DIAGNOSIS — G43009 Migraine without aura, not intractable, without status migrainosus: Secondary | ICD-10-CM

## 2024-05-26 ENCOUNTER — Other Ambulatory Visit: Payer: Self-pay | Admitting: Primary Care

## 2024-05-26 DIAGNOSIS — G43009 Migraine without aura, not intractable, without status migrainosus: Secondary | ICD-10-CM

## 2024-06-18 ENCOUNTER — Ambulatory Visit: Admitting: Family Medicine

## 2024-06-18 ENCOUNTER — Encounter: Payer: Self-pay | Admitting: Family Medicine

## 2024-06-18 ENCOUNTER — Ambulatory Visit: Payer: Self-pay

## 2024-06-18 ENCOUNTER — Telehealth: Payer: Self-pay

## 2024-06-18 VITALS — BP 170/90 | HR 74 | Temp 99.4°F | Ht 71.0 in | Wt 279.0 lb

## 2024-06-18 DIAGNOSIS — F321 Major depressive disorder, single episode, moderate: Secondary | ICD-10-CM | POA: Diagnosis not present

## 2024-06-18 DIAGNOSIS — F411 Generalized anxiety disorder: Secondary | ICD-10-CM

## 2024-06-18 DIAGNOSIS — R030 Elevated blood-pressure reading, without diagnosis of hypertension: Secondary | ICD-10-CM | POA: Diagnosis not present

## 2024-06-18 MED ORDER — SERTRALINE HCL 50 MG PO TABS: 50.0000 mg | ORAL_TABLET | Freq: Every day | ORAL | 3 refills | Status: AC

## 2024-06-18 NOTE — Telephone Encounter (Signed)
 Copied from CRM #[id number]. Topic: General - Other >> Jun 18, 2024  4:06 PM Burnard DEL wrote: Reason for CRM: Patient was seen in office today with Dr Avelina and she was informed that she would be able to receive a note stating that she would be able to work 3 days from home now instead of two days. Patient would like to when would this note be available for her? Patient would like for not e to be placed on her mychart.

## 2024-06-18 NOTE — Telephone Encounter (Signed)
 FYI Only or Action Required?: FYI only for provider: appointment scheduled on 06/18/24.  Patient was last seen in primary care on 02/02/2024 by Gretta Comer POUR, NP.  Called Nurse Triage reporting Anxiety.  Symptoms began several months ago.  Interventions attempted: Prescription medications: unsure of the name of what anxiety medication Dr Gretta had her taking.  Symptoms are: anxiety, depression, insomnia, intermittent dizziness that accompanies anxiety gradually worsening.  Triage Disposition: See Physician Within 24 Hours (overriding See PCP When Office is Open (Within 3 Days))  Patient/caregiver understands and will follow disposition?: Yes              Copied from CRM #[id number]. Topic: Clinical - Red Word Triage >> Jun 18, 2024  8:26 AM Aleatha BROCKS wrote: Red Word that prompted transfer to Nurse Triage: Patient has been taking anxiety medication and the anxiety has actually gotten worst Reason for Disposition  MODERATE anxiety (e.g., persistent or frequent anxiety symptoms; interferes with sleep, school, or work)  Answer Assessment - Initial Assessment Questions 1. CONCERN: Did anything happen that prompted you to call today?      Patient states she is on an anxiety medication prescribed by PCP Clark. No anxiety medication on home med list. Patient can not remember how long she has been on it, she thinks since August.  2. ANXIETY SYMPTOMS: Can you describe how you (your loved one; patient) have been feeling? (e.g., tense, restless, panicky, anxious, keyed up, overwhelmed, sense of impending doom).      Stress to the max, hard to get to sleep; over thinking.  3. ONSET: How long have you been feeling this way? (e.g., hours, days, weeks)     Worsening anxiety since June.  4. SEVERITY: How would you rate the level of anxiety? (e.g., 0 - 10; or mild, moderate, severe).     8/10.  5. FUNCTIONAL IMPAIRMENT: How have these feelings affected your ability to do daily  activities? Have you had more difficulty than usual doing your normal daily activities? (e.g., getting better, same, worse; self-care, school, work, interactions)     She states it has been affecting social interactions. She can't get into her normal routine.  6. HISTORY: Have you felt this way before? Have you ever been diagnosed with an anxiety problem in the past? (e.g., generalized anxiety disorder, panic attacks, PTSD). If Yes, ask: How was this problem treated? (e.g., medicines, counseling, etc.)     Yes, she states she felt this way a few years ago. She states she does not have a diagnosis.  7. RISK OF HARM - SUICIDAL IDEATION: Do you ever have thoughts of hurting or killing yourself? If Yes, ask:  Do you have these feelings now? Do you have a plan on how you would do this?     No.  8. TREATMENT:  What has been done so far to treat this anxiety? (e.g., medicines, relaxation strategies). What has helped?     Patient states she takes a medication but it is not helping. She has tried orthoptist.  9. THERAPIST: Do you have a counselor or therapist? If Yes, ask: What is their name?     She states she had a therapist years ago but has not been since. She is open to counseling/therapy, and it helped.  10. POTENTIAL TRIGGERS: Do you drink caffeinated beverages (e.g., coffee, colas, teas), and how much daily? Do you drink alcohol or use any drugs? Have you started any new medicines recently?  Patient states her job is highly stressful. Patient states no caffeine. No alcohol or drugs. No new medications.  11. PATIENT SUPPORT: Who is with you now? Who do you live with? Do you have family or friends who you can talk to?        She lives at home, she is her mother's caregiver. Her 2 daughters are in college. She states she doesn't have family or friends who she can talk to.  12. OTHER SYMPTOMS: Do you have any other symptoms? (e.g., feeling depressed, trouble  concentrating, trouble sleeping, trouble breathing, palpitations or fast heartbeat, chest pain, sweating, nausea, or diarrhea)       Trouble sleeping. She states palpitations of the heart but it's more like dizziness. Patient describes intermittent dizziness (not daily) when anxious, frustrated.  Protocols used: Anxiety and Panic Attack-A-AH

## 2024-06-18 NOTE — Telephone Encounter (Signed)
 Noted. Agree with nursing triage decision. Appreciate Dr Sherrel evaluation.

## 2024-06-18 NOTE — Progress Notes (Signed)
 Patient ID: [name], female    DOB: 1976-06-19, 48 y.o.   MRN: [id number]  This visit was conducted in person.  BP (!) 170/90   Pulse 74   Temp 99.4 F (37.4 C)   Ht 5' 11 (1.803 m)   Wt 279 lb (126.6 kg)   SpO2 98%   BMI 38.91 kg/m    CC:  Chief Complaint  Patient presents with   Anxiety    Everyday is a struggle with being anxious; just feeling so heavy everyday; going to work and anticipating something bad is going to happen; started over the summer   Depression    Subjective:   HPI: [name] is a 48 y.o. female presenting on 06/18/2024 for Anxiety (Everyday is a struggle with being anxious; just feeling so heavy everyday; going to work and anticipating something bad is going to happen; started over the summer) and Depression  GAD, MDD Ongoing since the summer, but this is much worse in last month She feels depressed, heaviness.  Very tearful.  She has a continuous worry that something bad going happen.  Occurring all day, trouble sleeping at night.  Trouble concentrating at night.  She feels tightly wound.  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  Work is overwhelming.    Had similar issues in past.. with situational  anxiety with hysterectomy... was as needed medication.      06/18/2024   12:05 PM 01/27/2021    3:10 PM  GAD 7 : Generalized Anxiety Score  Nervous, Anxious, on Edge 3 3  Control/stop worrying 3 2  Worry too much - different things 3 3  Trouble relaxing 3 2  Restless 2 2  Easily annoyed or irritable 3 2  Afraid - awful might happen 3 3  Total GAD 7 Score 20 17  Anxiety Difficulty Very difficult Somewhat difficult       06/18/2024   12:04 PM 01/19/2024   11:23 AM 08/02/2023    3:20 PM 07/22/2020    3:16 PM  Depression screen PHQ 2/9  Decreased Interest 2 0 0 3  Down, Depressed, Hopeless 1 0 0 0  PHQ - 2 Score 3 0 0 3  Altered sleeping 3   2  Tired, decreased energy 3   2  Change in appetite 2   0  Feeling  bad or failure about yourself  2   0  Trouble concentrating 3   1  Moving slowly or fidgety/restless 2   0  Suicidal thoughts 0   0  PHQ-9 Score 18    8   Difficult doing work/chores Very difficult   Very difficult     Data saved with a previous flowsheet row definition         Relevant past medical, surgical, family and social history reviewed and updated as indicated. Interim medical history since our last visit reviewed. Allergies and medications reviewed and updated. Outpatient Medications Prior to Visit  Medication Sig Dispense Refill   amLODipine  (NORVASC ) 5 MG tablet Take 1 tablet (5 mg total) by mouth daily. for blood pressure. 90 tablet 2   aspirin-acetaminophen -caffeine (EXCEDRIN MIGRAINE) 250-250-65 MG tablet Take 2 tablets every 6 (six) hours as needed by mouth for headache or migraine.     famotidine  (PEPCID ) 20 MG tablet Take 1 tablet (20 mg total) by mouth 2 (two) times daily. for heartburn. 180 tablet 3   hydrochlorothiazide  (HYDRODIURIL ) 25 MG tablet Take 1 tablet (25  mg total) by mouth daily. for blood pressure. 90 tablet 2   propranolol  ER (INDERAL  LA) 80 MG 24 hr capsule TAKE 1 CAPSULE BY MOUTH AT BEDTIME FOR HEADACHE PREVENTION 90 capsule 1   SUMAtriptan  (IMITREX ) 50 MG tablet TAKE 1 TABLET BY MOUTH AT MIGRAINE ONSET,. MAY REPEAT IN 2 HOURS IF HEADACHE PERSISTS OR RECURS. 10 tablet 0   predniSONE  (DELTASONE ) 20 MG tablet Take 2 tablets (40 mg total) by mouth daily. (Patient not taking: Reported on 06/18/2024) 10 tablet 0   No facility-administered medications prior to visit.     Per HPI unless specifically indicated in ROS section below Review of Systems  Constitutional:  Negative for fatigue and fever.  HENT:  Negative for congestion.   Eyes:  Negative for pain.  Respiratory:  Negative for cough and shortness of breath.   Cardiovascular:  Negative for chest pain, palpitations and leg swelling.  Gastrointestinal:  Negative for abdominal pain.  Genitourinary:   Negative for dysuria and vaginal bleeding.  Musculoskeletal:  Negative for back pain.  Neurological:  Negative for syncope, light-headedness and headaches.  Psychiatric/Behavioral:  Positive for dysphoric mood and sleep disturbance. Negative for agitation, self-injury and suicidal ideas. The patient is nervous/anxious.    Objective:  BP (!) 170/90   Pulse 74   Temp 99.4 F (37.4 C)   Ht 5' 11 (1.803 m)   Wt 279 lb (126.6 kg)   SpO2 98%   BMI 38.91 kg/m   Wt Readings from Last 3 Encounters:  06/18/24 279 lb (126.6 kg)  02/02/24 263 lb (119.3 kg)  01/19/24 255 lb (115.7 kg)      Physical Exam Constitutional:      General: She is not in acute distress.    Appearance: Normal appearance. She is well-developed. She is not ill-appearing or toxic-appearing.  HENT:     Head: Normocephalic.     Right Ear: Hearing, tympanic membrane, ear canal and external ear normal. Tympanic membrane is not erythematous, retracted or bulging.     Left Ear: Hearing, tympanic membrane, ear canal and external ear normal. Tympanic membrane is not erythematous, retracted or bulging.     Nose: No mucosal edema or rhinorrhea.     Right Sinus: No maxillary sinus tenderness or frontal sinus tenderness.     Left Sinus: No maxillary sinus tenderness or frontal sinus tenderness.     Mouth/Throat:     Pharynx: Uvula midline.  Eyes:     General: Lids are normal. Lids are everted, no foreign bodies appreciated.     Conjunctiva/sclera: Conjunctivae normal.     Pupils: Pupils are equal, round, and reactive to light.  Neck:     Thyroid : No thyroid  mass or thyromegaly.     Vascular: No carotid bruit.     Trachea: Trachea normal.  Cardiovascular:     Rate and Rhythm: Normal rate and regular rhythm.     Pulses: Normal pulses.     Heart sounds: Normal heart sounds, S1 normal and S2 normal. No murmur heard.    No friction rub. No gallop.  Pulmonary:     Effort: Pulmonary effort is normal. No tachypnea or respiratory  distress.     Breath sounds: Normal breath sounds. No decreased breath sounds, wheezing, rhonchi or rales.  Abdominal:     General: Bowel sounds are normal.     Palpations: Abdomen is soft.     Tenderness: There is no abdominal tenderness.  Musculoskeletal:     Cervical back: Normal range of  motion and neck supple.  Skin:    General: Skin is warm and dry.     Findings: No rash.  Neurological:     Mental Status: She is alert.  Psychiatric:        Mood and Affect: Mood is not anxious or depressed. Affect is tearful.        Speech: Speech normal.        Behavior: Behavior is withdrawn. Behavior is cooperative.        Thought Content: Thought content normal.        Judgment: Judgment normal.       Results for orders placed or performed in visit on 02/02/24  Basic metabolic panel with GFR   Collection Time: 02/02/24 10:55 AM  Result Value Ref Range   Sodium 138 135 - 145 mEq/L   Potassium 4.0 3.5 - 5.1 mEq/L   Chloride 102 96 - 112 mEq/L   CO2 30 19 - 32 mEq/L   Glucose, Bld 98 70 - 99 mg/dL   BUN 18 6 - 23 mg/dL   Creatinine, Ser 8.63 (H) 0.40 - 1.20 mg/dL   GFR 53.16 (L) >39.99 mL/min   Calcium 9.2 8.4 - 10.5 mg/dL    Assessment and Plan  GAD (generalized anxiety disorder) -     Ambulatory referral to Psychology  Current moderate episode of major depressive disorder without prior episode [facility]) Assessment & Plan: New diagnosis, poorly controlled. Recommended stress reduction and relaxation techniques.  Referred to psychology for initiation of therapy. Will start sertraline  50 mg p.o. nightly.  Reviewed expected possible side effects, expected length of time until effect, and duration of treatment.  She will follow-up in 4 weeks for reevaluation. Return and ER precautions provided.   Orders: -     Ambulatory referral to Psychology  Elevated BP without diagnosis of hypertension Assessment & Plan: Acute, likely elevated given the mood issues and anxiety. Recommend  following blood pressure at home and calling the office if her blood pressure is persistently above 140/90.   Other orders -     Sertraline  HCl; Take 1 tablet (50 mg total) by mouth daily.  Dispense: 30 tablet; Refill: 3    Return in about 4 weeks (around 07/16/2024) for follow up mood.   Greig Ring, MD

## 2024-06-19 ENCOUNTER — Encounter: Payer: Self-pay | Admitting: Family Medicine

## 2024-06-19 NOTE — Telephone Encounter (Signed)
 LMOM informing Pt that letter has been sent to Mychart.

## 2024-06-19 NOTE — Telephone Encounter (Signed)
 Please let patient know I have sent the work note via MyChart for her to print.  If she needs this signed we can print a copy for signature and pick up.

## 2024-06-19 NOTE — Telephone Encounter (Signed)
**Note De-identified  [place] Obfuscation** Please advise 

## 2024-06-26 DIAGNOSIS — R030 Elevated blood-pressure reading, without diagnosis of hypertension: Secondary | ICD-10-CM | POA: Insufficient documentation

## 2024-06-26 DIAGNOSIS — F321 Major depressive disorder, single episode, moderate: Secondary | ICD-10-CM | POA: Insufficient documentation

## 2024-06-26 NOTE — Assessment & Plan Note (Signed)
 New diagnosis, poorly controlled. Recommended stress reduction and relaxation techniques.  Referred to psychology for initiation of therapy. Will start sertraline  50 mg p.o. nightly.  Reviewed expected possible side effects, expected length of time until effect, and duration of treatment.  She will follow-up in 4 weeks for reevaluation. Return and ER precautions provided.

## 2024-06-26 NOTE — Assessment & Plan Note (Signed)
 Acute, likely elevated given the mood issues and anxiety. Recommend following blood pressure at home and calling the office if her blood pressure is persistently above 140/90.

## 2024-07-06 ENCOUNTER — Other Ambulatory Visit: Payer: Self-pay | Admitting: Primary Care

## 2024-07-06 DIAGNOSIS — G43009 Migraine without aura, not intractable, without status migrainosus: Secondary | ICD-10-CM

## 2024-07-23 ENCOUNTER — Ambulatory Visit: Admitting: Primary Care

## 2024-08-06 ENCOUNTER — Ambulatory Visit: Admitting: Primary Care

## 2024-09-05 ENCOUNTER — Ambulatory Visit: Admitting: Primary Care
